# Patient Record
Sex: Female | Born: 1955 | Race: White | Hispanic: No | Marital: Married | Smoking: Current every day smoker
Health system: Southern US, Community
[De-identification: ages and names within clinical notes are randomized; demographics above are authoritative.]

## PROBLEM LIST (undated history)

## (undated) DIAGNOSIS — F329 Major depressive disorder, single episode, unspecified: Secondary | ICD-10-CM

## (undated) DIAGNOSIS — F419 Anxiety disorder, unspecified: Secondary | ICD-10-CM

## (undated) DIAGNOSIS — K219 Gastro-esophageal reflux disease without esophagitis: Secondary | ICD-10-CM

## (undated) DIAGNOSIS — M810 Age-related osteoporosis without current pathological fracture: Secondary | ICD-10-CM

## (undated) DIAGNOSIS — F32A Depression, unspecified: Secondary | ICD-10-CM

## (undated) HISTORY — DX: Anxiety disorder, unspecified: F41.9

## (undated) HISTORY — DX: Major depressive disorder, single episode, unspecified: F32.9

## (undated) HISTORY — DX: Age-related osteoporosis without current pathological fracture: M81.0

## (undated) HISTORY — DX: Depression, unspecified: F32.A

## (undated) HISTORY — DX: Gastro-esophageal reflux disease without esophagitis: K21.9

## (undated) HISTORY — PX: TUBAL LIGATION: SHX77

---

## 2006-11-29 ENCOUNTER — Ambulatory Visit: Payer: Self-pay | Admitting: Internal Medicine

## 2006-12-04 ENCOUNTER — Ambulatory Visit: Payer: Self-pay | Admitting: Internal Medicine

## 2008-02-24 IMAGING — US ULTRASOUND RIGHT BREAST
1 series · 17 of 25 positions shown · non-contrast
Comparison: none

REASON FOR EXAM: right breast nodular density  Us if needed
COMMENTS:

[Series 1: ultrasound right breast · 17 of 27 slices shown]
[im 1/27]
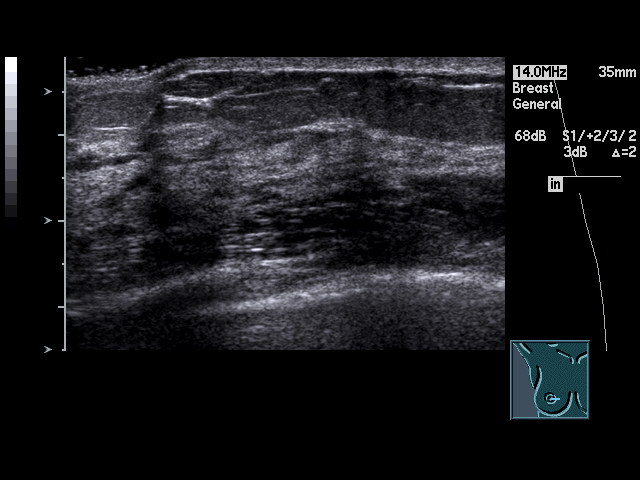
[im 3/27]
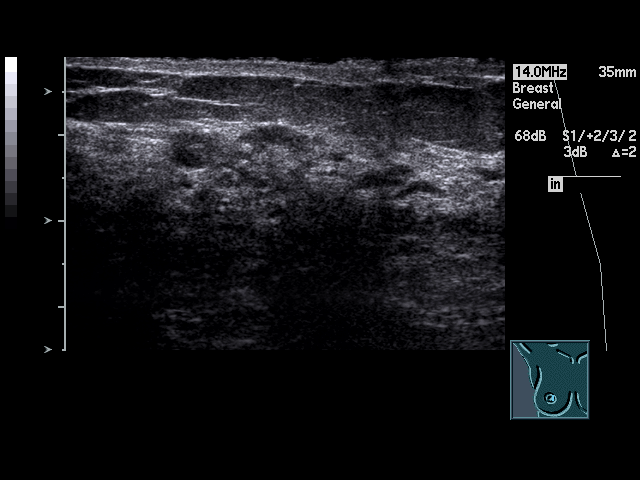
[im 4/27]
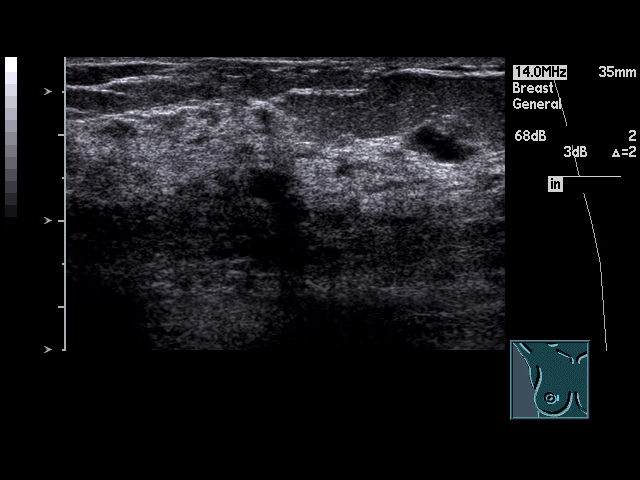
[im 6/27]
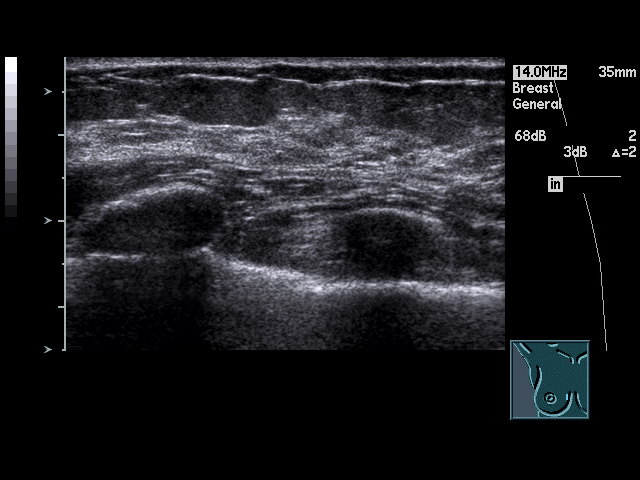
[im 7/27]
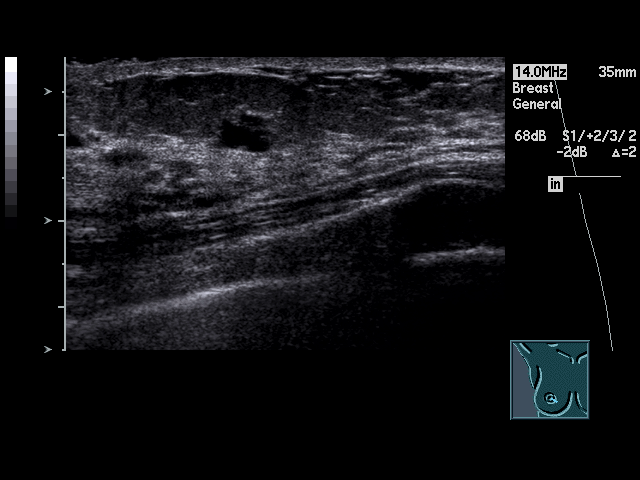
[im 9/27]
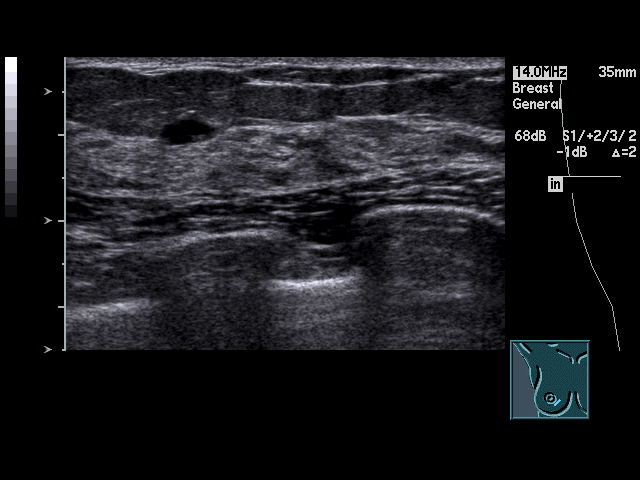
[im 10/27]
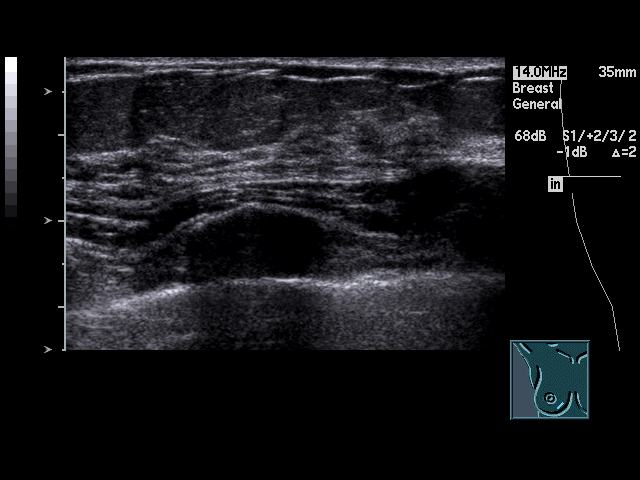
[im 12/27]
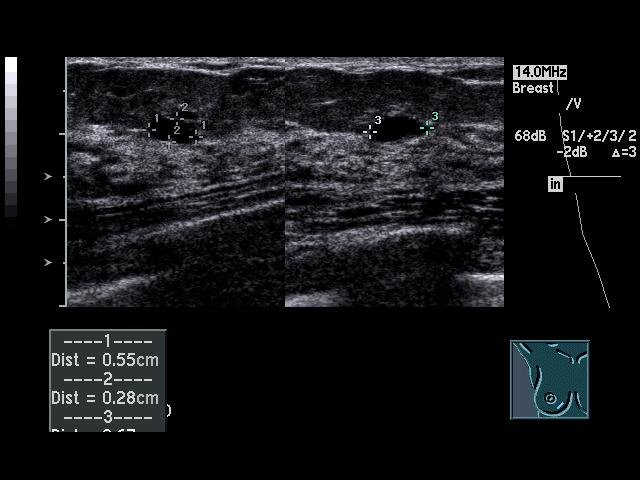
[im 14/27]
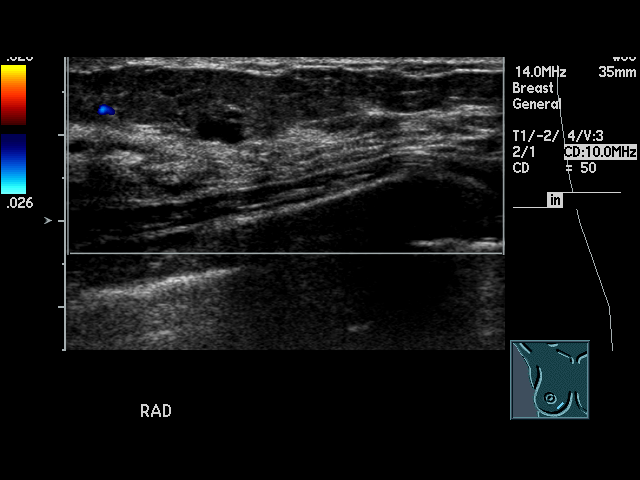
[im 15/27]
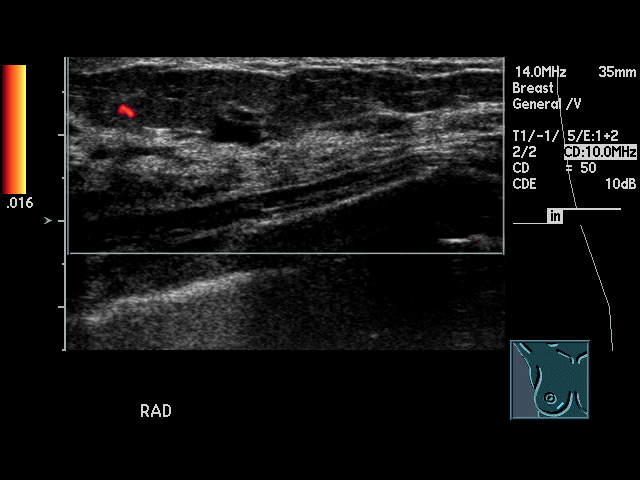
[im 17/27]
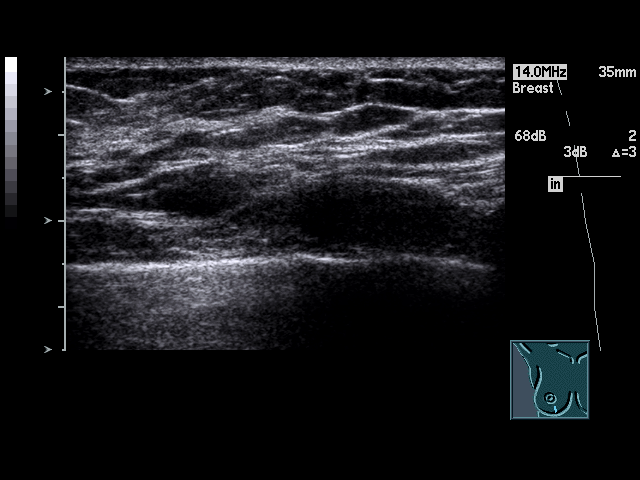
[im 18/27]
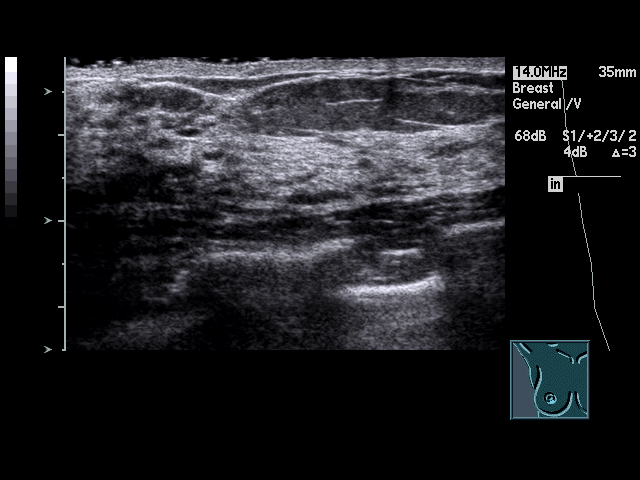
[im 20/27]
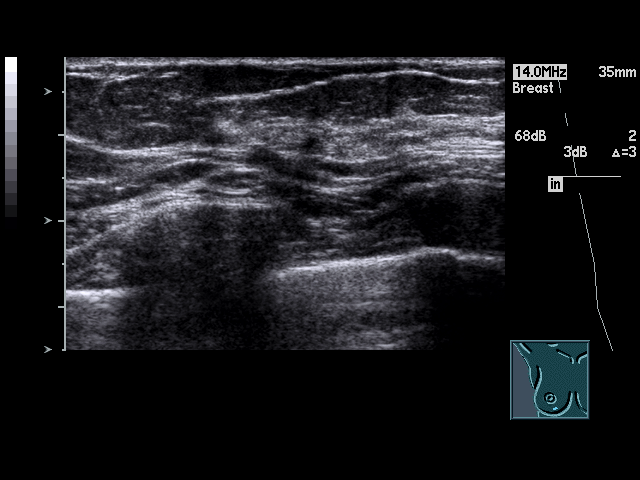
[im 21/27]
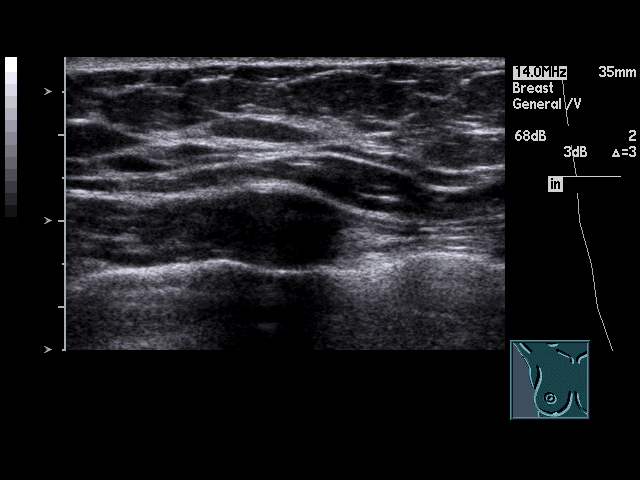
[im 23/27]
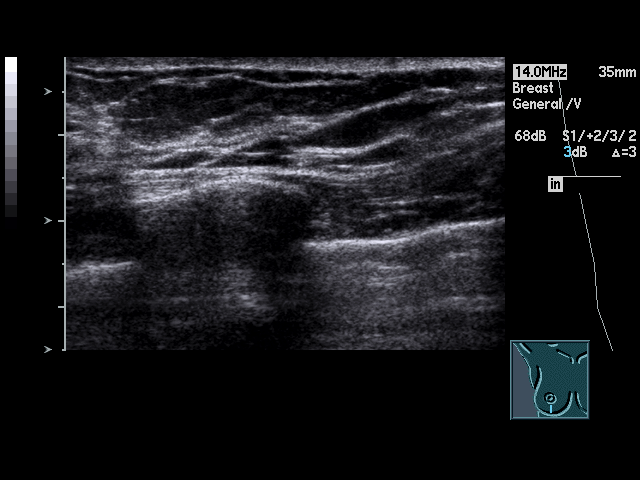
[im 24/27]
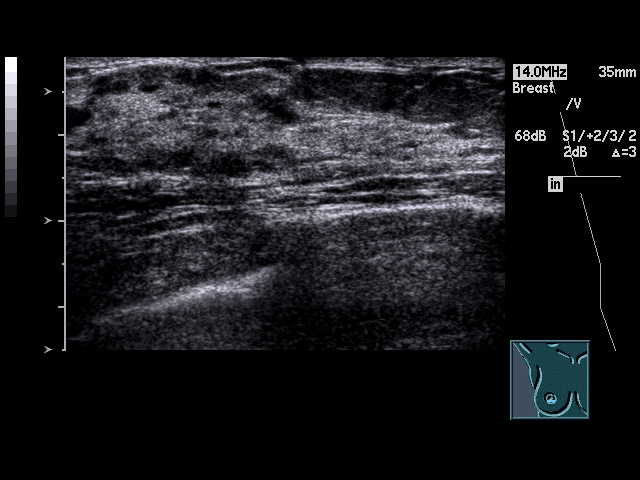
[im 27/27]
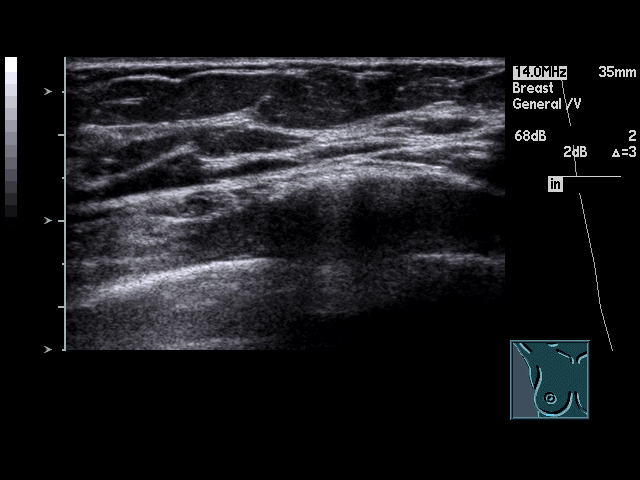

[17 of 25 positions shown; findings below may reference images not displayed]

PROCEDURE:     US  - US BREAST RIGHT  - December 04, 2006  [DATE]

RESULT:     Nodular density is noted in the medial portion of the
retroareolar portion of the RIGHT breast corresponding to abnormality on
mammogram.  The nodular density is anechoic and is consistent with a tiny
cyst.  No further evaluation is needed and yearly followup mammogram can be
obtained.
IMPRESSION: BI-RADS 2 - Benign Findings.

## 2009-01-13 ENCOUNTER — Ambulatory Visit: Payer: Self-pay | Admitting: Internal Medicine

## 2010-04-05 ENCOUNTER — Ambulatory Visit: Payer: Self-pay | Admitting: Internal Medicine

## 2010-05-12 ENCOUNTER — Ambulatory Visit: Payer: Self-pay | Admitting: General Practice

## 2010-06-21 ENCOUNTER — Ambulatory Visit: Payer: Self-pay | Admitting: Gastroenterology

## 2010-06-23 LAB — PATHOLOGY REPORT

## 2010-10-25 ENCOUNTER — Ambulatory Visit: Payer: Self-pay | Admitting: Otolaryngology

## 2011-01-28 ENCOUNTER — Ambulatory Visit: Payer: Self-pay

## 2011-03-07 ENCOUNTER — Ambulatory Visit: Payer: Self-pay

## 2011-03-10 ENCOUNTER — Ambulatory Visit: Payer: Self-pay

## 2011-08-02 IMAGING — CT CT ABDOMEN AND PELVIS WITHOUT AND WITH CONTRAST
2 of 4 series · 14 of 32 positions shown, 19 images · non-contrast
Comparison: none

REASON FOR EXAM: RENAL NEOPLASM UNCERTAIN HEMATURIA
COMMENTS:

PROCEDURE:     CT  - CT ABDOMEN / PELVIS  W/WO  - May 12, 2010  [DATE]
RESULT:
TECHNIQUE: Triphasic CT of the abdomen and pelvis is compared to a previous
noncontrast CT of the abdomen and pelvis dated 04/05/2010.

[Series 3: with · axial · 0.71mm/px · z∈[-140,+205]mm · 8 of 89 slices shown, 13 images]
[im 10/89  soft-tissue]
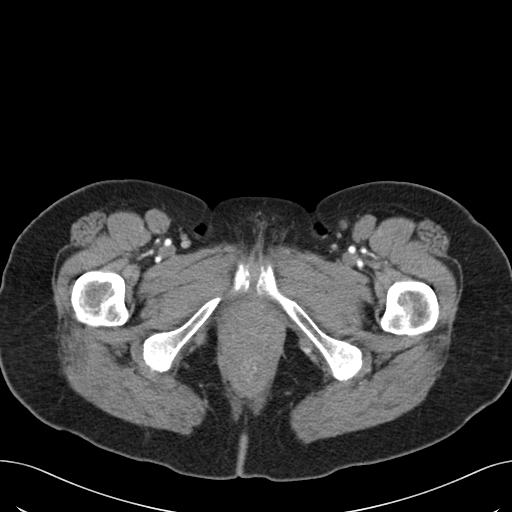
[im 10/89  bone]
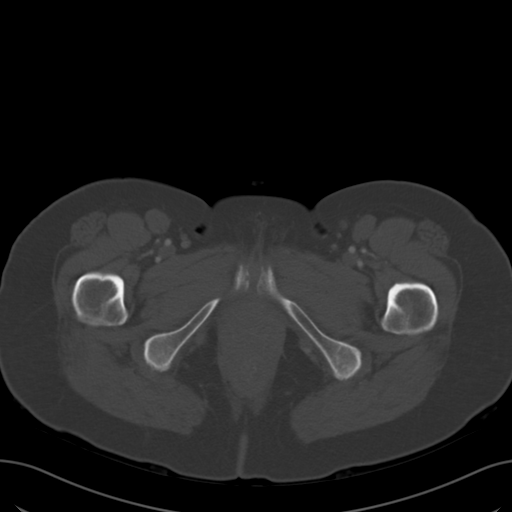
[im 20/89  soft-tissue]
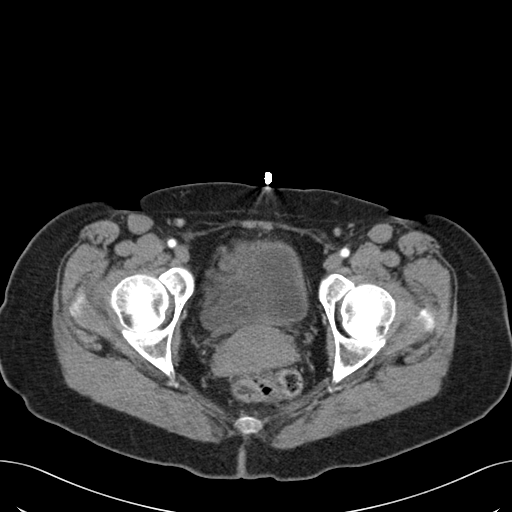
[im 30/89  soft-tissue]
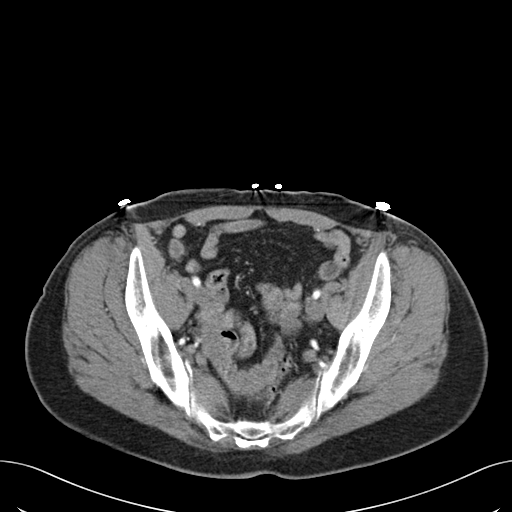
[im 40/89  soft-tissue]
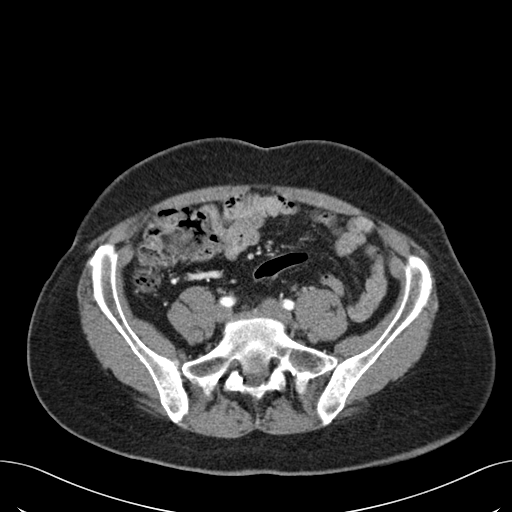
[im 49/89  soft-tissue]
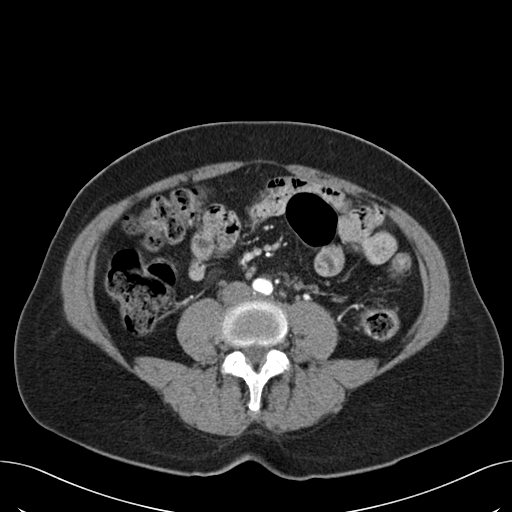
[im 49/89  lung]
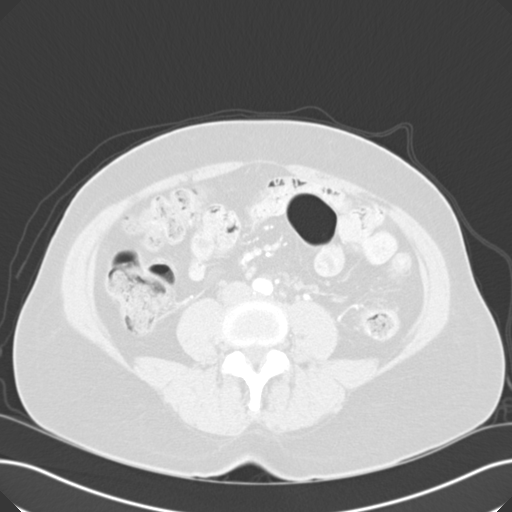
[im 59/89  soft-tissue]
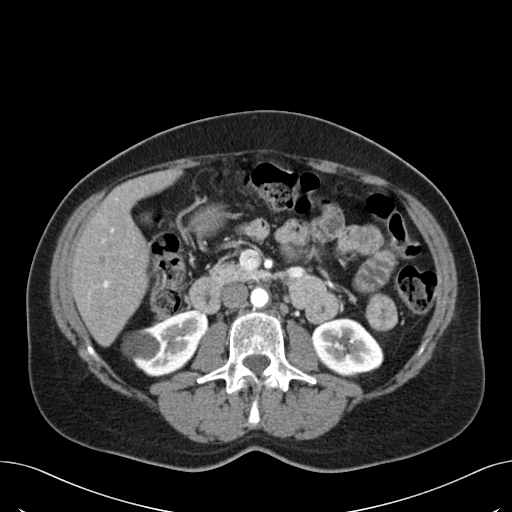
[im 59/89  lung]
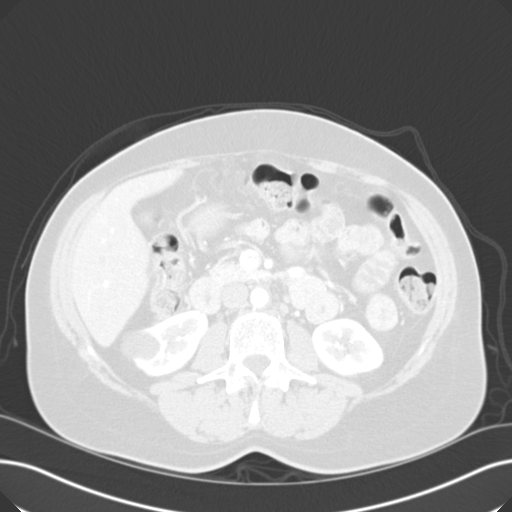
[im 69/89  soft-tissue]
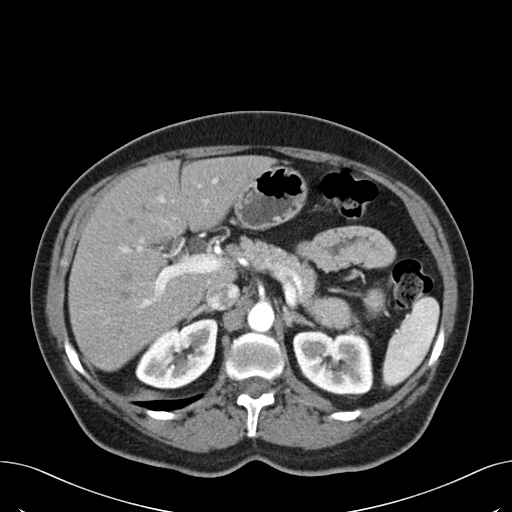
[im 69/89  lung]
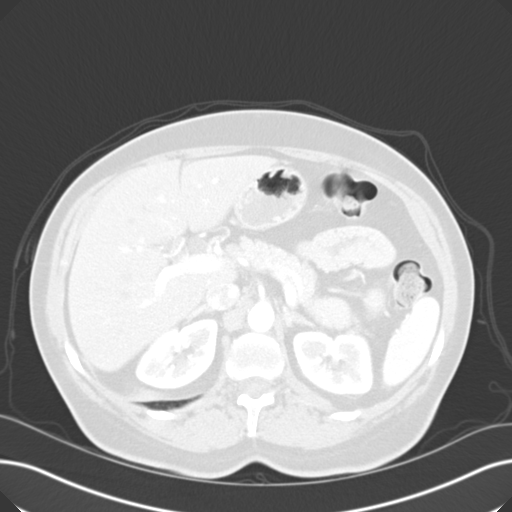
[im 79/89  soft-tissue]
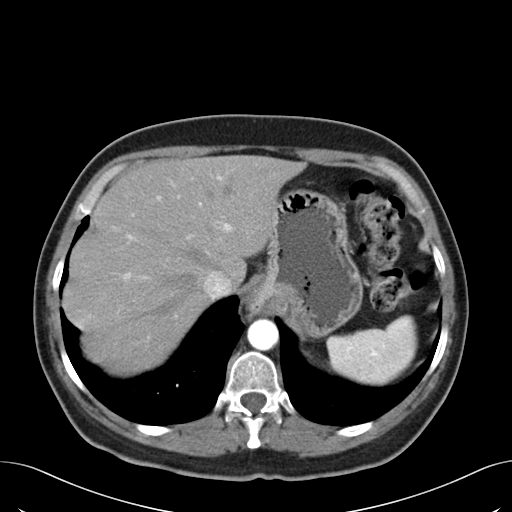
[im 79/89  lung]
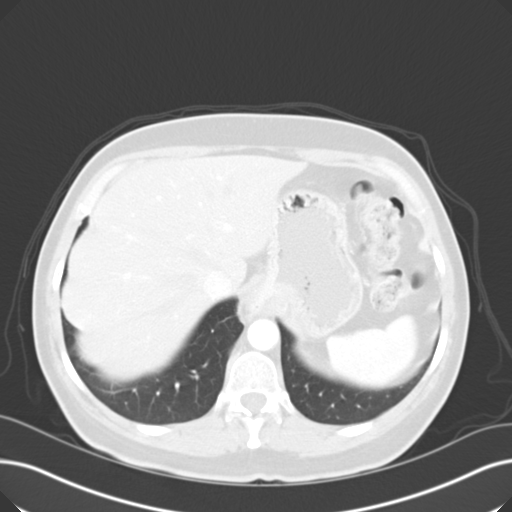

[Series 6: delay · axial · delayed · 0.71mm/px · z∈[-140,+105]mm · 6 of 89 slices shown]
[im 10/89  soft-tissue]
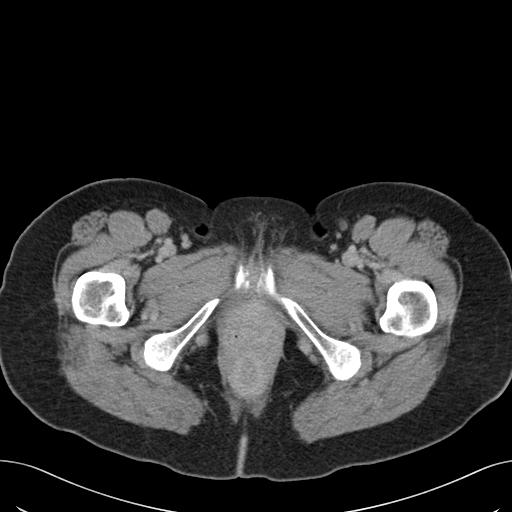
[im 20/89  soft-tissue]
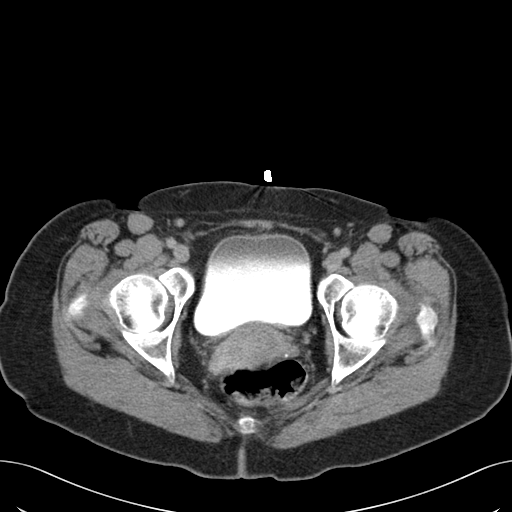
[im 30/89  soft-tissue]
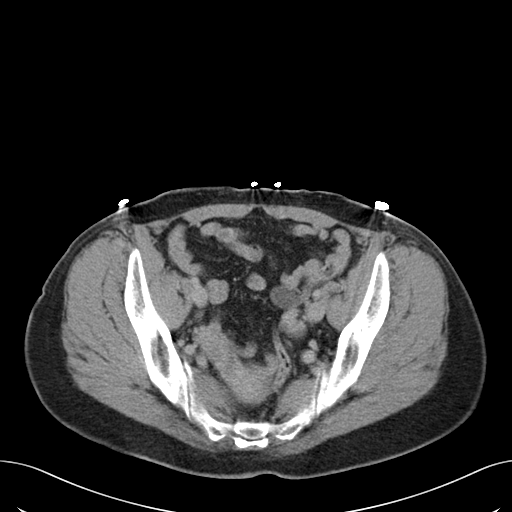
[im 40/89  soft-tissue]
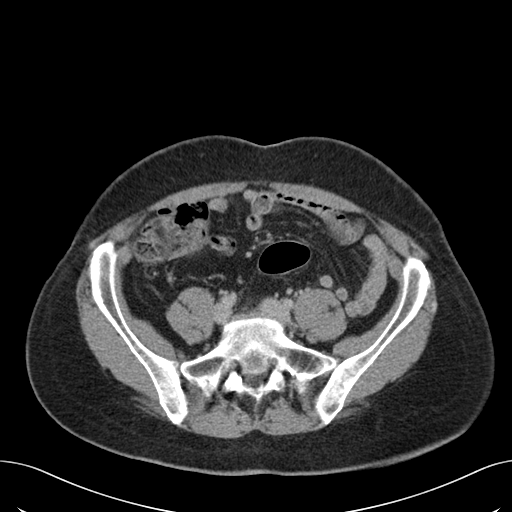
[im 49/89  soft-tissue]
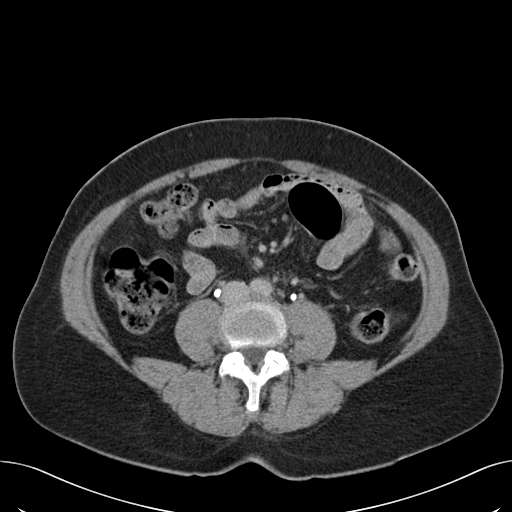
[im 59/89  soft-tissue]
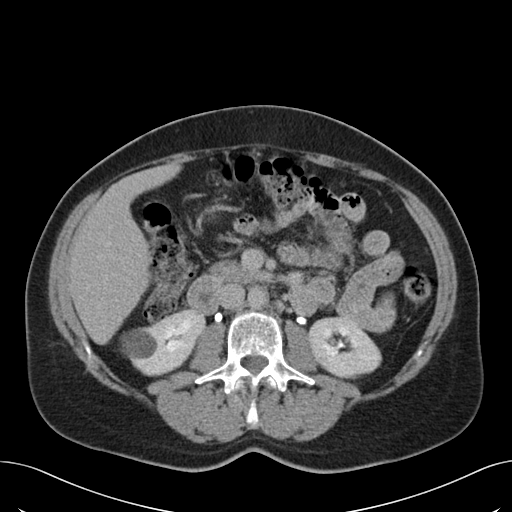

[14 of 32 positions shown; findings below may reference images not displayed]

FINDINGS: Noncontrast images show an exophytic, smoothly marginated lesion
arising from the junction of the mid and lower portions of the right kidney
demonstrating a Hounsfield reading of 7.0 and on the immediate post contrast
images this has a Hounsfield reading of minus 1. On delayed post contrast
images this has a Hounsfield reading of 5.0. This is most compatible with a
simple renal cyst.

A nonobstructing posterior mid left renal stone is present measuring
approximately 2 mm. No right nephrolithiasis is evident. There is no
hydroureteronephrosis. The urinary bladder appears nondistended. There is no
evidence of an acute inflammatory process in the abdomen or pelvis. No
radiopaque gallstones are evident. There is a small to moderate, hiatal
hernia. The aorta shows atherosclerotic irregularity without aneurysm. The
uterus is retroverted. No adnexal mass is appreciated. There is no
adenopathy. The liver and spleen enhance homogeneously. The adrenal glands
and pancreas appear unremarkable.

On delayed post contrast images both kidneys excrete contrast opacified
urine. There is no filling defect evident within the bladder.
IMPRESSION: 1.  Right renal cyst.
2.  Left nephrolithiasis.
3.  Moderately large, hiatal hernia.

## 2011-08-24 ENCOUNTER — Ambulatory Visit: Payer: Self-pay | Admitting: Gastroenterology

## 2012-04-19 IMAGING — CR CERVICAL SPINE - COMPLETE 4+ VIEW
1 series · 6 of 6 positions shown · non-contrast
Comparison: none

REASON FOR EXAM: neck pain post fall
COMMENTS:

[Series 1: view not recorded · 0.17mm/px · 6 of 6 slices shown]
[im 1/6]
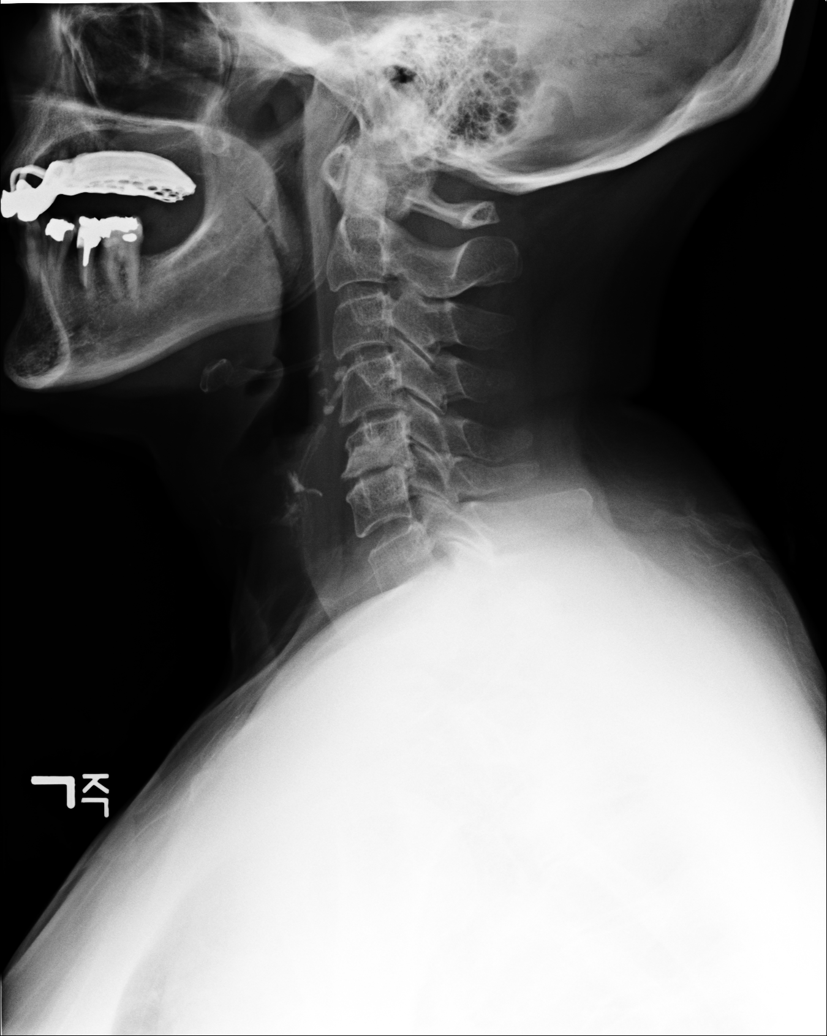
[im 2/6]
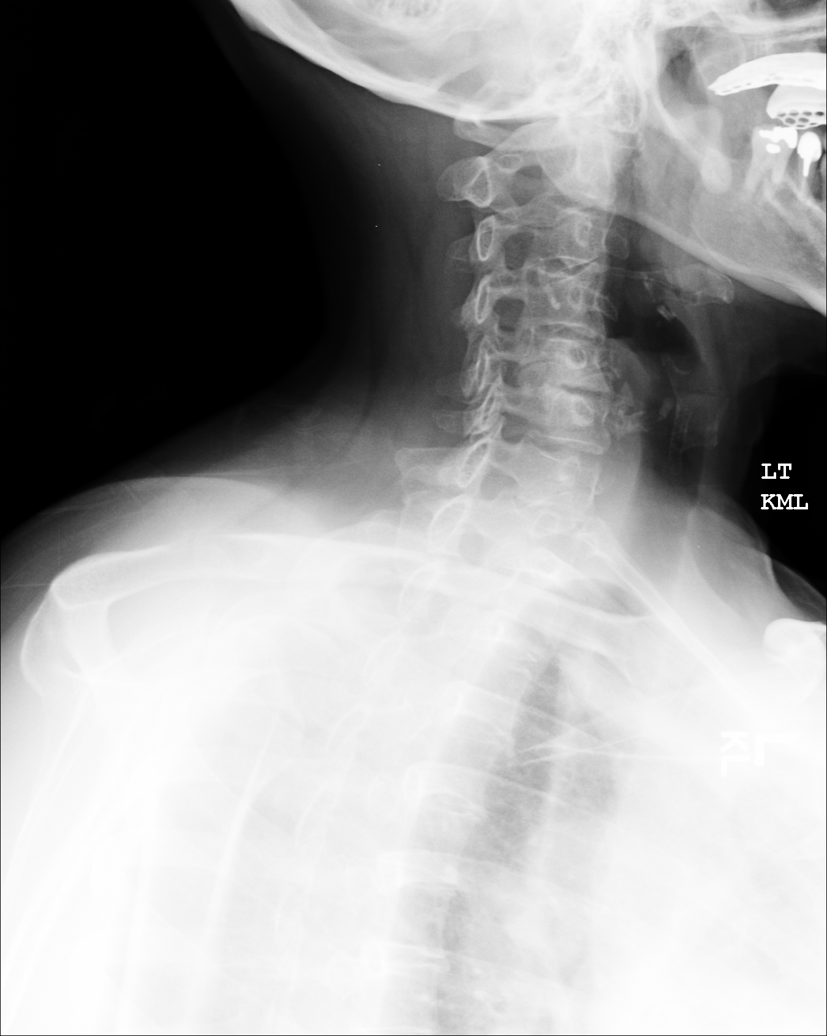
[im 3/6]
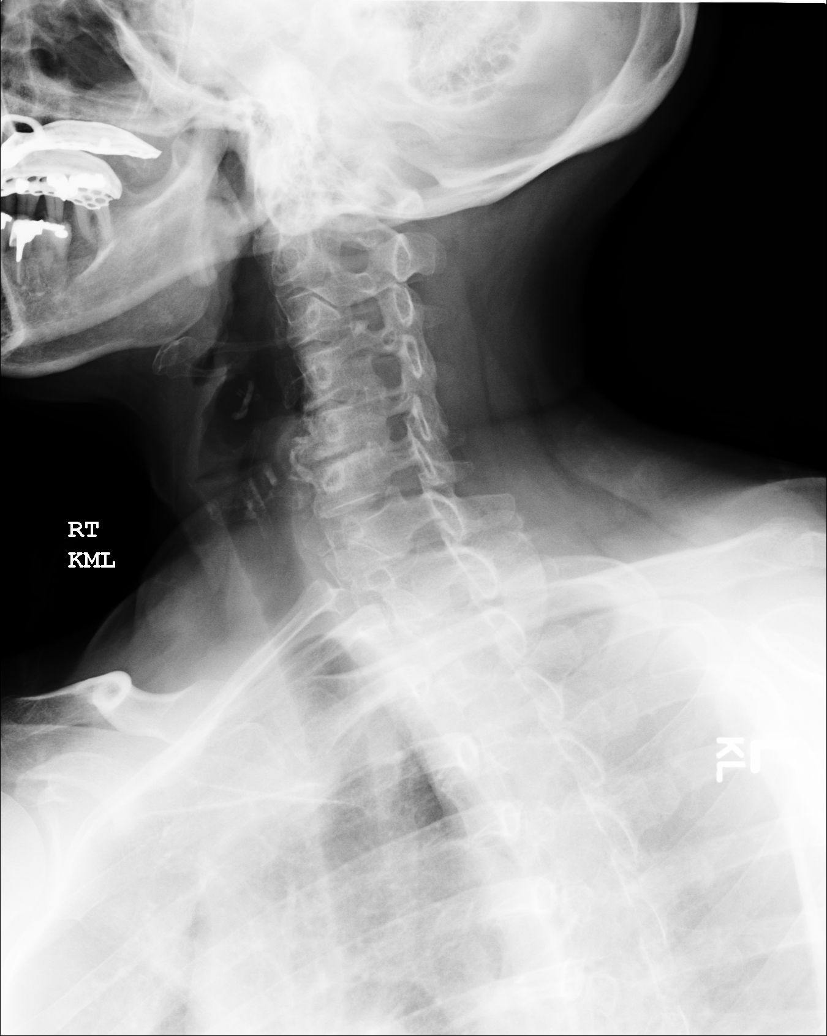
[im 4/6]
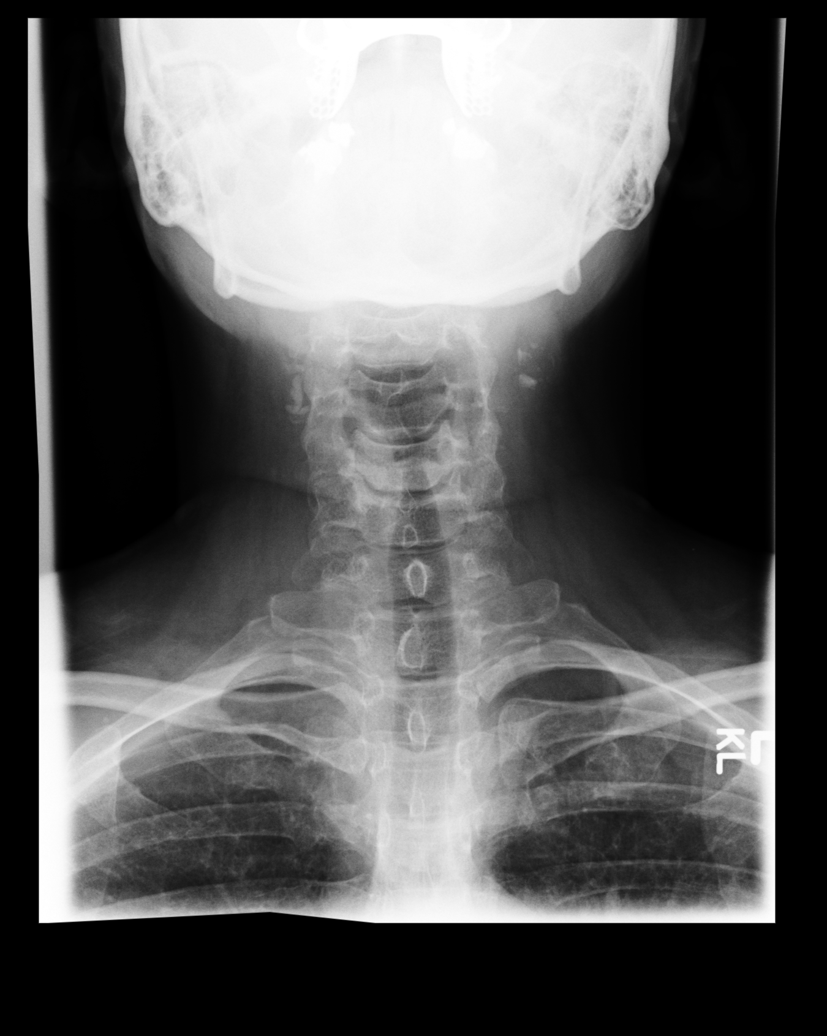
[im 5/6]
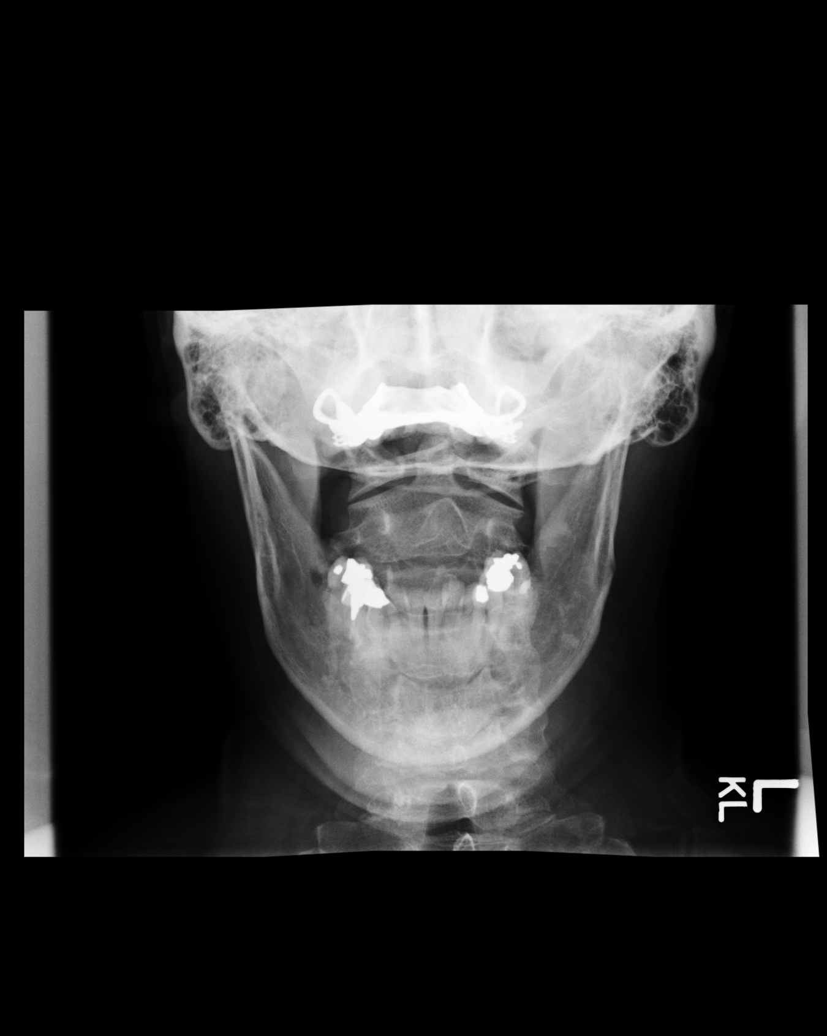
[im 6/6]
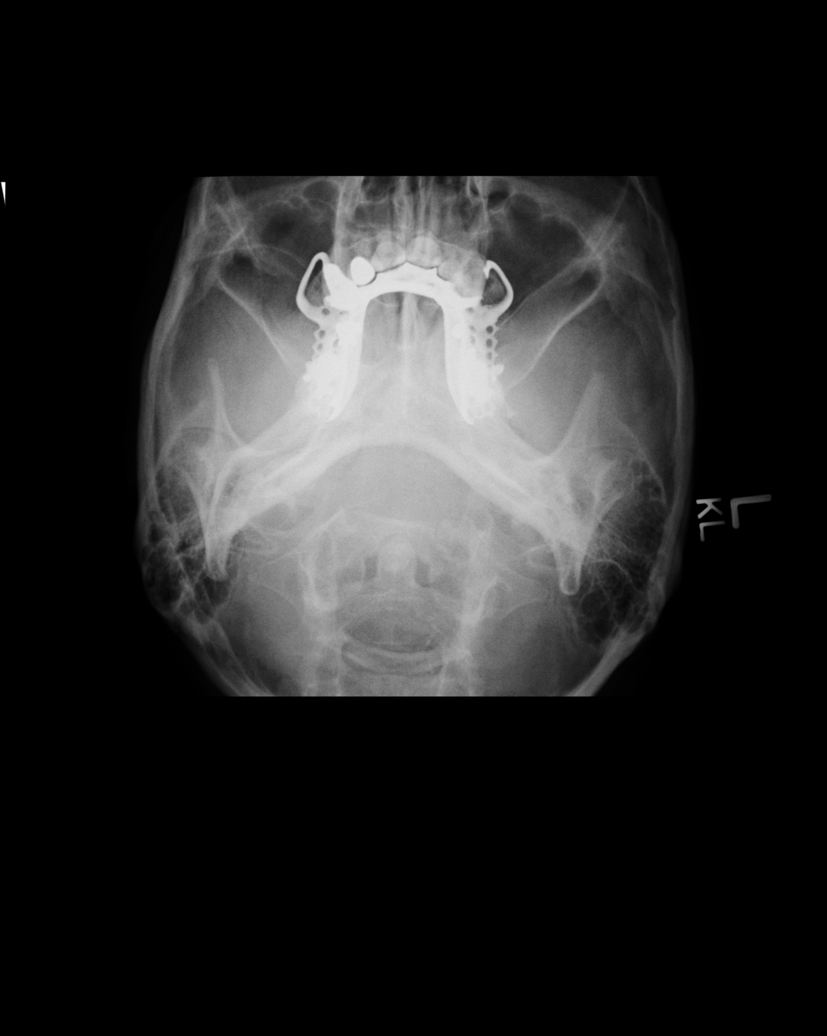

[6 of 6 positions shown; findings below may reference images not displayed]

PROCEDURE:     DXR - DXR CERVICAL SPINE COMPLETE  - January 28, 2011  [DATE]

RESULT:     The cervical vertebral bodies are preserved in height. There is
disc space narrowing with endplate sclerosis at C5-C6. Very mild
retrolisthesis of C5 with respect to C6 is demonstrated. The prevertebral
soft tissue spaces appear normal. The spinous processes are intact. The
oblique views reveal mild encroachment upon the neural foramina bilaterally
at C5-C6. The odontoid is intact. The lateral masses of C1 align normally
with those of C2. There are calcifications in the region of the carotid
bulbs bilaterally.
IMPRESSION: 1. The findings are consistent with degenerative change of the mid cervical
spine centered at C5-C6. Mild facet joint degenerative change is present in
addition to disc space narrowing. I do not see evidence of an acute fracture.
2. There is atherosclerotic calcification in the region of the cervical
carotid bulbs.

## 2013-01-16 ENCOUNTER — Ambulatory Visit: Payer: Self-pay | Admitting: Gastroenterology

## 2013-07-18 ENCOUNTER — Other Ambulatory Visit: Payer: Self-pay

## 2013-07-18 LAB — CBC WITH DIFFERENTIAL/PLATELET
Basophil #: 0.1 10*3/uL (ref 0.0–0.1)
Basophil %: 0.7 %
Eosinophil #: 0.1 10*3/uL (ref 0.0–0.7)
HCT: 39.4 % (ref 35.0–47.0)
HGB: 13.8 g/dL (ref 12.0–16.0)
Lymphocyte %: 33.5 %
MCHC: 35 g/dL (ref 32.0–36.0)
MCV: 92 fL (ref 80–100)
Monocyte #: 0.5 x10 3/mm (ref 0.2–0.9)
Neutrophil #: 4.8 10*3/uL (ref 1.4–6.5)
Neutrophil %: 58.4 %
RDW: 13.1 % (ref 11.5–14.5)
WBC: 8.3 10*3/uL (ref 3.6–11.0)

## 2013-07-18 LAB — COMPREHENSIVE METABOLIC PANEL
BUN: 13 mg/dL (ref 7–18)
Co2: 31 mmol/L (ref 21–32)
Creatinine: 0.82 mg/dL (ref 0.60–1.30)
EGFR (African American): >60
Glucose: 83 mg/dL (ref 65–99)
Osmolality: 277 (ref 275–301)
SGOT(AST): 17 U/L (ref 15–37)
Total Protein: 7.4 g/dL (ref 6.4–8.2)

## 2013-07-18 LAB — LIPID PANEL
Cholesterol: 223 mg/dL — ABNORMAL HIGH (ref 0–200)
HDL Cholesterol: 43 mg/dL (ref 40–60)
VLDL Cholesterol, Calc: 60 mg/dL — ABNORMAL HIGH (ref 5–40)

## 2013-07-18 LAB — TSH: Thyroid Stimulating Horm: 0.838 u[IU]/mL

## 2013-11-07 ENCOUNTER — Ambulatory Visit: Payer: Self-pay | Admitting: Gastroenterology

## 2017-12-26 ENCOUNTER — Other Ambulatory Visit: Payer: Self-pay

## 2017-12-26 MED ORDER — CITALOPRAM HYDROBROMIDE 20 MG PO TABS: 20.0000 mg | ORAL_TABLET | Freq: Two times a day (BID) | ORAL | 2 refills | Status: DC

## 2018-02-13 ENCOUNTER — Ambulatory Visit (INDEPENDENT_AMBULATORY_CARE_PROVIDER_SITE_OTHER): Payer: BLUE CROSS/BLUE SHIELD | Admitting: Nurse Practitioner

## 2018-02-13 ENCOUNTER — Encounter: Payer: Self-pay | Admitting: Nurse Practitioner

## 2018-02-13 VITALS — BP 140/84 | HR 69 | Resp 16 | Ht 62.0 in | Wt 151.4 lb

## 2018-02-13 DIAGNOSIS — F3341 Major depressive disorder, recurrent, in partial remission: Secondary | ICD-10-CM

## 2018-02-13 DIAGNOSIS — F411 Generalized anxiety disorder: Secondary | ICD-10-CM

## 2018-02-13 DIAGNOSIS — K219 Gastro-esophageal reflux disease without esophagitis: Secondary | ICD-10-CM

## 2018-02-13 MED ORDER — ALPRAZOLAM 0.5 MG PO TABS: 0.5000 mg | ORAL_TABLET | Freq: Two times a day (BID) | ORAL | 3 refills | Status: DC | PRN

## 2018-02-13 NOTE — Progress Notes (Signed)
[facility] [address] [place], [place] [address]  Internal MEDICINE  Office Visit Note  Patient Name: Felicia NeighborSherry B Blackwell  [id number]  [id number]  Date of Service: 02/14/2018  No chief complaint on file.   The patient is here for routine follow up exam. She has noted a few waves of sadness over the past few weeks. She states that this is worse on rainy and damp days. Her son committed suicide a little over a year ago. She is doing well, overall, except for these few episodes. She is taking her citalopram every day. Does take alprazolam as needed for acute anxiety and her waves of sadness. These medications help a lot and she needs to have refills    Pt is here for routine follow up.    Current Medication: Outpatient Encounter Medications as of 02/13/2018  Medication Sig  . albuterol (PROVENTIL HFA;VENTOLIN HFA) 108 (90 Base) MCG/ACT inhaler Inhale into the lungs.  . ALPRAZolam (XANAX) 0.5 MG tablet Take 1 tablet (0.5 mg total) by mouth 2 (two) times daily as needed for anxiety.  Marland Kitchen. amoxicillin-clavulanate (AUGMENTIN) 875-125 MG tablet   . citalopram (CELEXA) 20 MG tablet Take 1 tablet (20 mg total) by mouth 2 (two) times daily.  . ranitidine (ZANTAC) 150 MG tablet Take 150 mg by mouth daily as needed for heartburn.  . [DISCONTINUED] ALPRAZolam (XANAX) 0.5 MG tablet    No facility-administered encounter medications on file as of 02/13/2018.     Surgical History: Past Surgical History:  Procedure Laterality Date  . TUBAL LIGATION      Medical History: Past Medical History:  Diagnosis Date  . Anxiety   . Depression   . GERD (gastroesophageal reflux disease)     Family History: No family history on file.  Social History   Socioeconomic History  . Marital status: Married    Spouse name: Not on file  . Number of children: Not on file  . Years of education: Not on file  . Highest education level: Not on file  Occupational History  . Not on file  Social Needs   . Financial resource strain: Not on file  . Food insecurity:    Worry: Not on file    Inability: Not on file  . Transportation needs:    Medical: Not on file    Non-medical: Not on file  Tobacco Use  . Smoking status: Current Every Day Smoker    Packs/day: 1.00    Types: Cigarettes  . Smokeless tobacco: Never Used  Substance and Sexual Activity  . Alcohol use: Never    Frequency: Never  . Drug use: Never  . Sexual activity: Not on file  Lifestyle  . Physical activity:    Days per week: Not on file    Minutes per session: Not on file  . Stress: Not on file  Relationships  . Social connections:    Talks on phone: Not on file    Gets together: Not on file    Attends religious service: Not on file    Active member of club or organization: Not on file    Attends meetings of clubs or organizations: Not on file    Relationship status: Not on file  . Intimate partner violence:    Fear of current or ex partner: Not on file    Emotionally abused: Not on file    Physically abused: Not on file    Forced sexual activity: Not on file  Other Topics Concern  .  Not on file  Social History Narrative  . Not on file      Review of Systems  Constitutional: Negative for activity change, chills, fatigue and unexpected weight change.  HENT: Positive for voice change. Negative for congestion, postnasal drip, rhinorrhea, sneezing and sore throat.   Eyes: Negative.  Negative for redness.  Respiratory: Negative for cough, chest tightness, shortness of breath and wheezing.   Cardiovascular: Negative for chest pain and palpitations.  Gastrointestinal: Negative for abdominal pain, constipation, diarrhea, nausea and vomiting.  Endocrine: Negative for cold intolerance, heat intolerance, polydipsia, polyphagia and polyuria.  Genitourinary: Negative.  Negative for dysuria and frequency.  Musculoskeletal: Negative for arthralgias, back pain, joint swelling and neck pain.  Skin: Negative for rash.   Allergic/Immunologic: Positive for environmental allergies.  Neurological: Positive for headaches. Negative for dizziness, tremors, weakness and numbness.  Hematological: Negative for adenopathy. Does not bruise/bleed easily.  Psychiatric/Behavioral: Positive for dysphoric mood. Negative for behavioral problems (Depression), sleep disturbance and suicidal ideas. The patient is nervous/anxious.     Today's Vitals   02/13/18 1143 02/14/18 0952  BP: (!) 162/96 140/84  Pulse: 69   Resp: 16   SpO2: 98%   Weight: 151 lb 6.4 oz (68.7 kg)   Height: 5\' 2"  (1.575 m)     Physical Exam  Constitutional: She is oriented to person, place, and time. She appears well-developed and well-nourished. No distress.  HENT:  Head: Normocephalic and atraumatic.  Mouth/Throat: Oropharynx is clear and moist. No oropharyngeal exudate.  Eyes: Pupils are equal, round, and reactive to light. EOM are normal.  Neck: Normal range of motion. Neck supple. No JVD present. Carotid bruit is not present. No tracheal deviation present. No thyromegaly present.  Cardiovascular: Normal rate, regular rhythm and normal heart sounds. Exam reveals no gallop and no friction rub.  No murmur heard. Pulmonary/Chest: Effort normal and breath sounds normal. No respiratory distress. She has no wheezes. She has no rales. She exhibits no tenderness.  Abdominal: Soft. Bowel sounds are normal. There is no tenderness.  Musculoskeletal: Normal range of motion.  Lymphadenopathy:    She has no cervical adenopathy.  Neurological: She is alert and oriented to person, place, and time. No cranial nerve deficit.  Skin: Skin is warm and dry. She is not diaphoretic.  Psychiatric: Her behavior is normal. Judgment and thought content normal. Her mood appears anxious. Cognition and memory are normal.  Nursing note and vitals reviewed.   Assessment/Plan: 1. Generalized anxiety disorder Overall, anxiety well controlled. May continue taking alprazolam  0.5mg  twice daily if needed for anxiety. New rx proided today.  - ALPRAZolam (XANAX) 0.5 MG tablet; Take 1 tablet (0.5 mg total) by mouth 2 (two) times daily as needed for anxiety.  Dispense: 60 tablet; Refill: 3  2. Recurrent major depressive disorder, in partial remission (HCC) Generally well controlled. Continue citalopram daily   3. Gastroesophageal reflux disease without esophagitis Continue uranidine as before.  General Counseling: mishael krysiak understanding of the findings of todays visit and agrees with plan of treatment. I have discussed any further diagnostic evaluation that may be needed or ordered today. We also reviewed her medications today. she has been encouraged to call the office with any questions or concerns that should arise related to todays visit.   This patient was seen by Vincent Gros, FNP- C in Collaboration with Dr Lyndon Code as a part of collaborative care agreement   Meds ordered this encounter  Medications  . ALPRAZolam (XANAX) 0.5 MG tablet  Sig: Take 1 tablet (0.5 mg total) by mouth 2 (two) times daily as needed for anxiety.    Dispense:  60 tablet    Refill:  3    Please add refills to current prescription. Not ready for refill at this time.    Order Specific Question:   Supervising Provider    Answer:   Lyndon Code [[id number]]    Time spent: 60 Minutes      Dr Lyndon Code Internal medicine

## 2018-02-14 DIAGNOSIS — K219 Gastro-esophageal reflux disease without esophagitis: Secondary | ICD-10-CM | POA: Insufficient documentation

## 2018-02-14 DIAGNOSIS — F329 Major depressive disorder, single episode, unspecified: Secondary | ICD-10-CM | POA: Insufficient documentation

## 2018-02-14 DIAGNOSIS — F411 Generalized anxiety disorder: Secondary | ICD-10-CM | POA: Insufficient documentation

## 2018-02-14 DIAGNOSIS — F32A Depression, unspecified: Secondary | ICD-10-CM | POA: Insufficient documentation

## 2018-04-09 ENCOUNTER — Other Ambulatory Visit: Payer: Self-pay

## 2018-04-09 MED ORDER — CITALOPRAM HYDROBROMIDE 20 MG PO TABS: 20.0000 mg | ORAL_TABLET | Freq: Two times a day (BID) | ORAL | 2 refills | Status: DC

## 2018-06-14 ENCOUNTER — Encounter: Payer: Self-pay | Admitting: Nurse Practitioner

## 2018-06-14 ENCOUNTER — Ambulatory Visit: Payer: BLUE CROSS/BLUE SHIELD | Admitting: Nurse Practitioner

## 2018-06-14 VITALS — BP 138/70 | HR 68 | Resp 16 | Ht 62.0 in | Wt 152.8 lb

## 2018-06-14 DIAGNOSIS — K219 Gastro-esophageal reflux disease without esophagitis: Secondary | ICD-10-CM

## 2018-06-14 DIAGNOSIS — F3341 Major depressive disorder, recurrent, in partial remission: Secondary | ICD-10-CM | POA: Diagnosis not present

## 2018-06-14 DIAGNOSIS — F411 Generalized anxiety disorder: Secondary | ICD-10-CM

## 2018-06-14 MED ORDER — CITALOPRAM HYDROBROMIDE 20 MG PO TABS: 20.0000 mg | ORAL_TABLET | Freq: Two times a day (BID) | ORAL | 2 refills | Status: DC

## 2018-06-14 MED ORDER — ALPRAZOLAM 0.5 MG PO TABS: 0.5000 mg | ORAL_TABLET | Freq: Two times a day (BID) | ORAL | 3 refills | Status: DC | PRN

## 2018-06-14 NOTE — Progress Notes (Signed)
[facility] [address] [place], [place] [address]  Internal MEDICINE  Office Visit Note  Patient Name: Felicia Blackwell  [id number]  [id number]  Date of Service: 06/14/2018  Chief Complaint  Patient presents with  . Anxiety    4 month follow up    She has noted worsening depression last week. It was her son's 62 year old's birthday. Her son committed suicide a little over a year ago. She is doing well, overall, except for these few episodes. This week is much better.  She is taking her citalopram every day. Does take alprazolam as needed for acute anxiety and her waves of sadness. These medications help a lot and she needs to have refills       Current Medication: Outpatient Encounter Medications as of 06/14/2018  Medication Sig  . ALPRAZolam (XANAX) 0.5 MG tablet Take 1 tablet (0.5 mg total) by mouth 2 (two) times daily as needed for anxiety.  Marland Kitchen amoxicillin-clavulanate (AUGMENTIN) 875-125 MG tablet   . citalopram (CELEXA) 20 MG tablet Take 1 tablet (20 mg total) by mouth 2 (two) times daily.  . ranitidine (ZANTAC) 150 MG tablet Take 150 mg by mouth daily as needed for heartburn.  . [DISCONTINUED] ALPRAZolam (XANAX) 0.5 MG tablet Take 1 tablet (0.5 mg total) by mouth 2 (two) times daily as needed for anxiety.  . [DISCONTINUED] citalopram (CELEXA) 20 MG tablet Take 1 tablet (20 mg total) by mouth 2 (two) times daily.  Marland Kitchen albuterol (PROVENTIL HFA;VENTOLIN HFA) 108 (90 Base) MCG/ACT inhaler Inhale into the lungs.   No facility-administered encounter medications on file as of 06/14/2018.     Surgical History: Past Surgical History:  Procedure Laterality Date  . TUBAL LIGATION      Medical History: Past Medical History:  Diagnosis Date  . Anxiety   . Depression   . GERD (gastroesophageal reflux disease)     Family History: Family History  Family history unknown: Yes    Social History   Socioeconomic History  . Marital status: Married    Spouse name: Not  on file  . Number of children: Not on file  . Years of education: Not on file  . Highest education level: Not on file  Occupational History  . Not on file  Social Needs  . Financial resource strain: Not on file  . Food insecurity:    Worry: Not on file    Inability: Not on file  . Transportation needs:    Medical: Not on file    Non-medical: Not on file  Tobacco Use  . Smoking status: Current Every Day Smoker    Packs/day: 1.00    Types: Cigarettes  . Smokeless tobacco: Never Used  Substance and Sexual Activity  . Alcohol use: Never    Frequency: Never  . Drug use: Never  . Sexual activity: Not on file  Lifestyle  . Physical activity:    Days per week: Not on file    Minutes per session: Not on file  . Stress: Not on file  Relationships  . Social connections:    Talks on phone: Not on file    Gets together: Not on file    Attends religious service: Not on file    Active member of club or organization: Not on file    Attends meetings of clubs or organizations: Not on file    Relationship status: Not on file  . Intimate partner violence:    Fear of current or ex partner: Not on file  Emotionally abused: Not on file    Physically abused: Not on file    Forced sexual activity: Not on file  Other Topics Concern  . Not on file  Social History Narrative  . Not on file      Review of Systems  Constitutional: Negative for activity change, chills, fatigue and unexpected weight change.  HENT: Positive for voice change. Negative for congestion, postnasal drip, rhinorrhea, sneezing and sore throat.   Eyes: Negative.  Negative for redness.  Respiratory: Negative for cough, chest tightness, shortness of breath and wheezing.   Cardiovascular: Negative for chest pain and palpitations.  Gastrointestinal: Negative for abdominal pain, constipation, diarrhea, nausea and vomiting.  Endocrine: Negative for cold intolerance, heat intolerance, polydipsia, polyphagia and polyuria.   Genitourinary: Negative.  Negative for dysuria and frequency.  Musculoskeletal: Negative for arthralgias, back pain, joint swelling and neck pain.  Skin: Negative for rash.  Allergic/Immunologic: Positive for environmental allergies.  Neurological: Positive for headaches. Negative for dizziness, tremors, weakness and numbness.  Hematological: Negative for adenopathy. Does not bruise/bleed easily.  Psychiatric/Behavioral: Positive for dysphoric mood. Negative for behavioral problems (Depression), sleep disturbance and suicidal ideas. The patient is nervous/anxious.     Vital Signs: BP 138/70 (BP Location: Right Arm, Patient Position: Sitting, Cuff Size: Large)   Pulse 68   Resp 16   Ht 5\' 2"  (1.575 m)   Wt 152 lb 12.8 oz (69.3 kg)   SpO2 93%   BMI 27.95 kg/m    Physical Exam  Constitutional: She is oriented to person, place, and time. She appears well-developed and well-nourished. No distress.  HENT:  Head: Normocephalic and atraumatic.  Nose: Nose normal.  Mouth/Throat: Oropharynx is clear and moist. No oropharyngeal exudate.  Eyes: Pupils are equal, round, and reactive to light. Conjunctivae and EOM are normal.  Neck: Normal range of motion. Neck supple. No JVD present. Carotid bruit is not present. No tracheal deviation present. No thyromegaly present.  Cardiovascular: Normal rate, regular rhythm and normal heart sounds. Exam reveals no gallop and no friction rub.  No murmur heard. Pulmonary/Chest: Effort normal and breath sounds normal. No respiratory distress. She has no wheezes. She has no rales. She exhibits no tenderness.  Abdominal: Soft. Bowel sounds are normal. There is no tenderness.  Musculoskeletal: Normal range of motion.  Lymphadenopathy:    She has no cervical adenopathy.  Neurological: She is alert and oriented to person, place, and time. No cranial nerve deficit.  Skin: Skin is warm and dry. She is not diaphoretic.  Psychiatric: Her behavior is normal.  Judgment and thought content normal. Her mood appears anxious. Cognition and memory are normal.  Nursing note and vitals reviewed.   Assessment/Plan: 1. Gastroesophageal reflux disease without esophagitis Well managed, continue omeprazole as prescribed   2. Recurrent major depressive disorder, in partial remission (HCC) Continue citalopram 20mg , taking two tablets daily. New rx provided today.   3. Generalized anxiety disorder May continue alprazolam 0.5mg  twice daily for acute anxiety. New prescription provided today.  - ALPRAZolam (XANAX) 0.5 MG tablet; Take 1 tablet (0.5 mg total) by mouth 2 (two) times daily as needed for anxiety.  Dispense: 60 tablet; Refill: 3 - citalopram (CELEXA) 20 MG tablet; Take 1 tablet (20 mg total) by mouth 2 (two) times daily.  Dispense: 60 tablet; Refill: 2  General Counseling: Nyara verbalizes understanding of the findings of todays visit and agrees with plan of treatment. I have discussed any further diagnostic evaluation that may be needed or ordered today.  We also reviewed her medications today. she has been encouraged to call the office with any questions or concerns that should arise related to todays visit.  This patient was seen by Vincent GrosHeather Irva Loser FNP Collaboration with Dr Lyndon CodeFozia M Khan as a part of collaborative care agreement  Meds ordered this encounter  Medications  . ALPRAZolam (XANAX) 0.5 MG tablet    Sig: Take 1 tablet (0.5 mg total) by mouth 2 (two) times daily as needed for anxiety.    Dispense:  60 tablet    Refill:  3    Please add refills to current prescription. Not ready for refill at this time.    Order Specific Question:   Supervising Provider    Answer:   Lyndon CodeKHAN, FOZIA M [[id number]]  . citalopram (CELEXA) 20 MG tablet    Sig: Take 1 tablet (20 mg total) by mouth 2 (two) times daily.    Dispense:  60 tablet    Refill:  2    Order Specific Question:   Supervising Provider    Answer:   Lyndon CodeKHAN, FOZIA M [[id number]]    Time spent: 5215  Minutes      Dr Lyndon CodeFozia M Khan Internal medicine

## 2018-07-02 ENCOUNTER — Other Ambulatory Visit: Payer: Self-pay

## 2018-07-02 DIAGNOSIS — F411 Generalized anxiety disorder: Secondary | ICD-10-CM

## 2018-07-02 MED ORDER — CITALOPRAM HYDROBROMIDE 20 MG PO TABS: 20.0000 mg | ORAL_TABLET | Freq: Two times a day (BID) | ORAL | 3 refills | Status: DC

## 2018-08-20 ENCOUNTER — Ambulatory Visit: Payer: BLUE CROSS/BLUE SHIELD | Admitting: Nurse Practitioner

## 2018-08-20 ENCOUNTER — Encounter: Payer: Self-pay | Admitting: Nurse Practitioner

## 2018-08-20 VITALS — BP 151/72 | HR 68 | Temp 97.4°F | Resp 16 | Ht 62.0 in | Wt 152.0 lb

## 2018-08-20 DIAGNOSIS — Z7689 Persons encountering health services in other specified circumstances: Secondary | ICD-10-CM

## 2018-08-20 DIAGNOSIS — B373 Candidiasis of vulva and vagina: Secondary | ICD-10-CM | POA: Diagnosis not present

## 2018-08-20 DIAGNOSIS — R319 Hematuria, unspecified: Secondary | ICD-10-CM

## 2018-08-20 DIAGNOSIS — R030 Elevated blood-pressure reading, without diagnosis of hypertension: Secondary | ICD-10-CM | POA: Diagnosis not present

## 2018-08-20 DIAGNOSIS — N390 Urinary tract infection, site not specified: Secondary | ICD-10-CM | POA: Diagnosis not present

## 2018-08-20 DIAGNOSIS — B3731 Acute candidiasis of vulva and vagina: Secondary | ICD-10-CM

## 2018-08-20 DIAGNOSIS — R300 Dysuria: Secondary | ICD-10-CM | POA: Diagnosis not present

## 2018-08-20 DIAGNOSIS — Z202 Contact with and (suspected) exposure to infections with a predominantly sexual mode of transmission: Secondary | ICD-10-CM

## 2018-08-20 LAB — POCT URINALYSIS DIPSTICK
BILIRUBIN UA: NEGATIVE
GLUCOSE UA: NEGATIVE
Ketones, UA: NEGATIVE
Leukocytes, UA: NEGATIVE
Nitrite, UA: NEGATIVE
Protein, UA: NEGATIVE
Spec Grav, UA: 1.01 (ref 1.010–1.025)
Urobilinogen, UA: 0.2 E.U./dL
pH, UA: 6 (ref 5.0–8.0)

## 2018-08-20 MED ORDER — CIPROFLOXACIN HCL 500 MG PO TABS: 500.0000 mg | ORAL_TABLET | Freq: Two times a day (BID) | ORAL | 0 refills | Status: DC

## 2018-08-20 MED ORDER — PHENAZOPYRIDINE HCL 200 MG PO TABS: 200.0000 mg | ORAL_TABLET | Freq: Three times a day (TID) | ORAL | 0 refills | Status: DC | PRN

## 2018-08-20 MED ORDER — FLUCONAZOLE 150 MG PO TABS: ORAL_TABLET | ORAL | 0 refills | Status: DC

## 2018-08-20 NOTE — Progress Notes (Signed)
[facility] [address] [place], [place] [address]  Internal MEDICINE  Office Visit Note  Patient Name: Felicia Blackwell  [id number]  [id number]  Date of Service: 08/21/2018   Pt is here for a sick visit.  Chief Complaint  Patient presents with  . Urinary Tract Infection    lower abdominal/pelvic pain.     Patient states that she has had vaginal irritation for some time. Got worse after having intercourse with her husband in late August. She is concerned of exposure to sexually transmitted infection, as her husband has history of extramarital affairs. She is describing pelvic pain which gets worse when she urinates. Feels like a lot of pressure pushing down, similar to that of labor pains.   Urinary Tract Infection   This is a new problem. The current episode started more than 1 month ago. The problem occurs every urination. The problem has been gradually worsening. The quality of the pain is described as aching. The pain is at a severity of 4/10. The pain is moderate. There has been no fever. She is sexually active. There is no history of pyelonephritis. Associated symptoms include flank pain, frequency, hematuria and urgency. Pertinent negatives include no chills, nausea or vomiting. She has tried NSAIDs for the symptoms. The treatment provided mild relief.       Current Medication:  Outpatient Encounter Medications as of 08/20/2018  Medication Sig  . ALPRAZolam (XANAX) 0.5 MG tablet Take 1 tablet (0.5 mg total) by mouth 2 (two) times daily as needed for anxiety.  . citalopram (CELEXA) 20 MG tablet Take 1 tablet (20 mg total) by mouth 2 (two) times daily.  . ranitidine (ZANTAC) 150 MG tablet Take 150 mg by mouth daily as needed for heartburn.  Marland Kitchen albuterol (PROVENTIL HFA;VENTOLIN HFA) 108 (90 Base) MCG/ACT inhaler Inhale into the lungs.  . ciprofloxacin (CIPRO) 500 MG tablet Take 1 tablet (500 mg total) by mouth 2 (two) times daily.  . fluconazole (DIFLUCAN) 150 MG  tablet Take 1 tablet po once. May repeat dose in 3 days as needed for persistent symptoms.  . phenazopyridine (PYRIDIUM) 200 MG tablet Take 1 tablet (200 mg total) by mouth 3 (three) times daily as needed for pain.  . [DISCONTINUED] amoxicillin-clavulanate (AUGMENTIN) 875-125 MG tablet    No facility-administered encounter medications on file as of 08/20/2018.       Medical History: Past Medical History:  Diagnosis Date  . Anxiety   . Depression   . GERD (gastroesophageal reflux disease)      Today's Vitals   08/20/18 1507  BP: (!) 151/72  Pulse: 68  Resp: 16  Temp: (!) 97.4 F (36.3 C)  SpO2: 95%  Weight: 152 lb (68.9 kg)  Height: 5\' 2"  (1.575 m)    Review of Systems  Constitutional: Negative for activity change, chills, fatigue and unexpected weight change.  HENT: Negative for congestion, postnasal drip, rhinorrhea, sneezing, sore throat and voice change.   Eyes: Negative.  Negative for redness.  Respiratory: Negative for cough, chest tightness, shortness of breath and wheezing.   Cardiovascular: Negative for chest pain and palpitations.       Elevated blood pressure   Gastrointestinal: Negative for abdominal pain, constipation, diarrhea, nausea and vomiting.  Endocrine: Negative for cold intolerance, heat intolerance, polydipsia, polyphagia and polyuria.  Genitourinary: Positive for difficulty urinating, dysuria, flank pain, frequency, hematuria, pelvic pain and urgency.  Musculoskeletal: Negative for arthralgias, back pain, joint swelling and neck pain.  Skin: Negative for rash.  Allergic/Immunologic: Negative for environmental allergies.  Neurological: Negative for dizziness, tremors and numbness.  Hematological: Negative for adenopathy. Does not bruise/bleed easily.  Psychiatric/Behavioral: Positive for dysphoric mood. Negative for behavioral problems (Depression), sleep disturbance and suicidal ideas. The patient is nervous/anxious.     Physical Exam   Constitutional: She is oriented to person, place, and time. She appears well-developed and well-nourished. No distress.  HENT:  Head: Normocephalic and atraumatic.  Nose: Nose normal.  Mouth/Throat: Oropharynx is clear and moist. No oropharyngeal exudate.  Eyes: Pupils are equal, round, and reactive to light. Conjunctivae and EOM are normal.  Neck: Normal range of motion. Neck supple. No JVD present. No tracheal deviation present. No thyromegaly present.  Cardiovascular: Normal rate, regular rhythm and normal heart sounds. Exam reveals no gallop and no friction rub.  No murmur heard. Pulmonary/Chest: Effort normal and breath sounds normal. No respiratory distress. She has no wheezes. She has no rales. She exhibits no tenderness.  Abdominal: Soft. Bowel sounds are normal. There is tenderness.  Suprapubic tenderness present with palpation.  Genitourinary:  Genitourinary Comments: U/a was positive for moderate blood.   Musculoskeletal: Normal range of motion.  Lymphadenopathy:    She has no cervical adenopathy.  Neurological: She is alert and oriented to person, place, and time. No cranial nerve deficit.  Skin: Skin is warm and dry. Capillary refill takes less than 2 seconds. She is not diaphoretic.  Psychiatric: Her speech is normal and behavior is normal. Judgment and thought content normal. Her mood appears anxious. Cognition and memory are normal.  Nursing note and vitals reviewed.  Assessment/Plan: 1. Urinary tract infection with hematuria, site unspecified Start cipro 500mg  bid for 10 days. Send urine for culture and sensitivity and adjust antibiotics as indicated.  - CULTURE, URINE COMPREHENSIVE - ciprofloxacin (CIPRO) 500 MG tablet; Take 1 tablet (500 mg total) by mouth 2 (two) times daily.  Dispense: 20 tablet; Refill: 0  2. Vaginal candidiasis Take 1 tablet once. May repeat dose in 3 days for persistent symptoms.  - fluconazole (DIFLUCAN) 150 MG tablet; Take 1 tablet po once.  May repeat dose in 3 days as needed for persistent symptoms.  Dispense: 3 tablet; Refill: 0  3. Encounter for assessment of STD exposure Will check urine for exposure to STD and will treat as indicated  - Chlamydia/Gonococcus/Trichomonas, NAA  4. Elevated blood pressure reading without diagnosis of hypertension Generally stable. Will continue to monitor.  5. Dysuria - POCT Urinalysis Dipstick - phenazopyridine (PYRIDIUM) 200 MG tablet; Take 1 tablet (200 mg total) by mouth 3 (three) times daily as needed for pain.  Dispense: 10 tablet; Refill: 0  General Counseling: Jodelle verbalizes understanding of the findings of todays visit and agrees with plan of treatment. I have discussed any further diagnostic evaluation that may be needed or ordered today. We also reviewed her medications today. she has been encouraged to call the office with any questions or concerns that should arise related to todays visit.    Counseling:  Rest and increase fluids. Continue using OTC medication to control symptoms.   This patient was seen by Vincent Gros FNP Collaboration with Dr Lyndon Code as a part of collaborative care agreement  Orders Placed This Encounter  Procedures  . CULTURE, URINE COMPREHENSIVE  . Chlamydia/Gonococcus/Trichomonas, NAA  . POCT Urinalysis Dipstick    Meds ordered this encounter  Medications  . ciprofloxacin (CIPRO) 500 MG tablet    Sig: Take 1 tablet (500 mg total) by mouth 2 (two) times  daily.    Dispense:  20 tablet    Refill:  0    Order Specific Question:   Supervising Provider    Answer:   Lyndon Code [[id number]]  . phenazopyridine (PYRIDIUM) 200 MG tablet    Sig: Take 1 tablet (200 mg total) by mouth 3 (three) times daily as needed for pain.    Dispense:  10 tablet    Refill:  0    Order Specific Question:   Supervising Provider    Answer:   Lyndon Code [[id number]]  . fluconazole (DIFLUCAN) 150 MG tablet    Sig: Take 1 tablet po once. May repeat dose in 3 days as  needed for persistent symptoms.    Dispense:  3 tablet    Refill:  0    Order Specific Question:   Supervising Provider    Answer:   Lyndon Code [[id number]]    Time spent: 15 Minutes

## 2018-08-21 DIAGNOSIS — B373 Candidiasis of vulva and vagina: Secondary | ICD-10-CM | POA: Insufficient documentation

## 2018-08-21 DIAGNOSIS — N390 Urinary tract infection, site not specified: Secondary | ICD-10-CM | POA: Insufficient documentation

## 2018-08-21 DIAGNOSIS — R030 Elevated blood-pressure reading, without diagnosis of hypertension: Secondary | ICD-10-CM | POA: Insufficient documentation

## 2018-08-21 DIAGNOSIS — Z0001 Encounter for general adult medical examination with abnormal findings: Secondary | ICD-10-CM

## 2018-08-21 DIAGNOSIS — R319 Hematuria, unspecified: Secondary | ICD-10-CM

## 2018-08-21 DIAGNOSIS — R300 Dysuria: Secondary | ICD-10-CM | POA: Insufficient documentation

## 2018-08-21 DIAGNOSIS — B3731 Acute candidiasis of vulva and vagina: Secondary | ICD-10-CM | POA: Insufficient documentation

## 2018-08-21 DIAGNOSIS — Z79899 Other long term (current) drug therapy: Secondary | ICD-10-CM | POA: Insufficient documentation

## 2018-08-22 LAB — CULTURE, URINE COMPREHENSIVE

## 2018-08-22 LAB — CHLAMYDIA/GONOCOCCUS/TRICHOMONAS, NAA
Chlamydia by NAA: NEGATIVE
Gonococcus by NAA: NEGATIVE
TRICH VAG BY NAA: NEGATIVE

## 2018-10-16 ENCOUNTER — Ambulatory Visit (INDEPENDENT_AMBULATORY_CARE_PROVIDER_SITE_OTHER): Payer: BLUE CROSS/BLUE SHIELD | Admitting: Nurse Practitioner

## 2018-10-16 ENCOUNTER — Encounter: Payer: Self-pay | Admitting: Nurse Practitioner

## 2018-10-16 VITALS — BP 138/64 | HR 73 | Resp 16 | Ht 62.0 in | Wt 156.2 lb

## 2018-10-16 DIAGNOSIS — N390 Urinary tract infection, site not specified: Secondary | ICD-10-CM | POA: Diagnosis not present

## 2018-10-16 DIAGNOSIS — R319 Hematuria, unspecified: Secondary | ICD-10-CM

## 2018-10-16 DIAGNOSIS — R300 Dysuria: Secondary | ICD-10-CM

## 2018-10-16 DIAGNOSIS — F411 Generalized anxiety disorder: Secondary | ICD-10-CM

## 2018-10-16 DIAGNOSIS — F17210 Nicotine dependence, cigarettes, uncomplicated: Secondary | ICD-10-CM | POA: Insufficient documentation

## 2018-10-16 DIAGNOSIS — F419 Anxiety disorder, unspecified: Secondary | ICD-10-CM | POA: Insufficient documentation

## 2018-10-16 DIAGNOSIS — Z720 Tobacco use: Secondary | ICD-10-CM | POA: Insufficient documentation

## 2018-10-16 DIAGNOSIS — F3341 Major depressive disorder, recurrent, in partial remission: Secondary | ICD-10-CM

## 2018-10-16 DIAGNOSIS — Z0001 Encounter for general adult medical examination with abnormal findings: Secondary | ICD-10-CM

## 2018-10-16 MED ORDER — ALPRAZOLAM 0.5 MG PO TABS: 0.5000 mg | ORAL_TABLET | Freq: Two times a day (BID) | ORAL | 3 refills | Status: DC | PRN

## 2018-10-16 MED ORDER — CITALOPRAM HYDROBROMIDE 20 MG PO TABS: 20.0000 mg | ORAL_TABLET | Freq: Two times a day (BID) | ORAL | 3 refills | Status: DC

## 2018-10-16 NOTE — Progress Notes (Signed)
[facility] [address] [place], [place] [address]  Internal MEDICINE  Office Visit Note  Patient Name: Felicia Blackwell  [id number]  [id number]  Date of Service: 10/16/2018    Pt is here for routine health maintenance examination  Chief Complaint  Patient presents with  . Annual Exam    pt had a mammogram done recently  . Quality Metric Gaps    pt will get flu vaccine from pharmacy     The patient is here for routine health maintenance exam. Recently had her mammogram which was normal. She is due for flu shot and will get this at her pharmacy the next time she goes. Today, she has no concerns or complaints. She needs to have refills for her citalopram and alprazolam. Her anxiety and depression are well controled on these medications and she sleeps well.  She did have urinary tract infection at her last visit. She finished antibiotics and took diflucan for yeast infection. Her symptoms have resolved. Urine sample will be sent to the lab today. Will treat any residual infection.    Current Medication: Outpatient Encounter Medications as of 10/16/2018  Medication Sig  . ALPRAZolam (XANAX) 0.5 MG tablet Take 1 tablet (0.5 mg total) by mouth 2 (two) times daily as needed for anxiety.  . citalopram (CELEXA) 20 MG tablet Take 1 tablet (20 mg total) by mouth 2 (two) times daily.  . [DISCONTINUED] ALPRAZolam (XANAX) 0.5 MG tablet Take 1 tablet (0.5 mg total) by mouth 2 (two) times daily as needed for anxiety.  . [DISCONTINUED] citalopram (CELEXA) 20 MG tablet Take 1 tablet (20 mg total) by mouth 2 (two) times daily.  Marland Kitchen albuterol (PROVENTIL HFA;VENTOLIN HFA) 108 (90 Base) MCG/ACT inhaler Inhale into the lungs.  . ciprofloxacin (CIPRO) 500 MG tablet Take 1 tablet (500 mg total) by mouth 2 (two) times daily. (Patient not taking: Reported on 10/16/2018)  . fluconazole (DIFLUCAN) 150 MG tablet Take 1 tablet po once. May repeat dose in 3 days as needed for persistent symptoms.  (Patient not taking: Reported on 10/16/2018)  . phenazopyridine (PYRIDIUM) 200 MG tablet Take 1 tablet (200 mg total) by mouth 3 (three) times daily as needed for pain. (Patient not taking: Reported on 10/16/2018)  . ranitidine (ZANTAC) 150 MG tablet Take 150 mg by mouth daily as needed for heartburn.   No facility-administered encounter medications on file as of 10/16/2018.     Surgical History: Past Surgical History:  Procedure Laterality Date  . TUBAL LIGATION      Medical History: Past Medical History:  Diagnosis Date  . Anxiety   . Depression   . GERD (gastroesophageal reflux disease)     Family History: Family History  Family history unknown: Yes      Review of Systems  Constitutional: Negative for activity change, chills, fatigue and unexpected weight change.  HENT: Negative for congestion, postnasal drip, rhinorrhea, sneezing and voice change.   Eyes: Negative.  Negative for redness.  Respiratory: Negative for cough, chest tightness, shortness of breath and wheezing.   Cardiovascular: Negative for chest pain and palpitations.  Gastrointestinal: Negative for abdominal pain, constipation, diarrhea, nausea and vomiting.  Endocrine: Negative for cold intolerance, heat intolerance, polydipsia, polyphagia and polyuria.  Genitourinary: Negative for dysuria and frequency.  Musculoskeletal: Negative for arthralgias, back pain, joint swelling and neck pain.  Skin: Negative for rash.  Allergic/Immunologic: Positive for environmental allergies.  Neurological: Positive for headaches. Negative for dizziness, tremors, weakness and numbness.  Hematological: Negative for adenopathy.  Does not bruise/bleed easily.  Psychiatric/Behavioral: Positive for dysphoric mood. Negative for behavioral problems (Depression), sleep disturbance and suicidal ideas. The patient is nervous/anxious.      Vital Signs: BP 138/64 (BP Location: Right Arm, Patient Position: Sitting, Cuff Size: Large)    Pulse 73   Resp 16   Ht 5\' 2"  (1.575 m)   Wt 156 lb 3.2 oz (70.9 kg)   SpO2 97%   BMI 28.57 kg/m    Physical Exam  Constitutional: She is oriented to person, place, and time. She appears well-developed and well-nourished. No distress.  HENT:  Head: Normocephalic and atraumatic.  Nose: Nose normal.  Mouth/Throat: No oropharyngeal exudate.  Eyes: Pupils are equal, round, and reactive to light. Conjunctivae and EOM are normal.  Neck: Normal range of motion. Neck supple. No JVD present. Carotid bruit is not present. No tracheal deviation present. No thyromegaly present.  Cardiovascular: Normal rate, regular rhythm, normal heart sounds and intact distal pulses. Exam reveals no gallop and no friction rub.  No murmur heard. Pulmonary/Chest: Effort normal and breath sounds normal. No respiratory distress. She has no wheezes. She has no rales. She exhibits no tenderness.  Abdominal: Soft. Bowel sounds are normal. There is no tenderness.  Musculoskeletal: Normal range of motion.  Lymphadenopathy:    She has no cervical adenopathy.  Neurological: She is alert and oriented to person, place, and time. No cranial nerve deficit.  Skin: Skin is warm and dry. She is not diaphoretic.  Psychiatric: Her behavior is normal. Judgment and thought content normal. Her mood appears anxious. Cognition and memory are normal.  Nursing note and vitals reviewed.    LABS: Recent Results (from the past 2160 hour(s))  Chlamydia/Gonococcus/Trichomonas, NAA     Status: None   Collection Time: 08/20/18  3:16 PM  Result Value Ref Range   Chlamydia by NAA Negative Negative   Gonococcus by NAA Negative Negative   Trich vag by NAA Negative Negative  POCT Urinalysis Dipstick     Status: None   Collection Time: 08/20/18  3:19 PM  Result Value Ref Range   Color, UA     Clarity, UA     Glucose, UA Negative Negative   Bilirubin, UA negative    Ketones, UA negative    Spec Grav, UA 1.010 1.010 - 1.025   Blood, UA  moderate    pH, UA 6.0 5.0 - 8.0   Protein, UA Negative Negative   Urobilinogen, UA 0.2 0.2 or 1.0 E.U./dL   Nitrite, UA negative    Leukocytes, UA Negative Negative   Appearance     Odor    CULTURE, URINE COMPREHENSIVE     Status: None   Collection Time: 08/20/18  3:19 PM  Result Value Ref Range   Urine Culture, Comprehensive Final report    Organism ID, Bacteria Comment     Comment: Mixed urogenital flora 10,000-25,000 colony forming units per mL    Assessment/Plan: 1. Encounter for general adult medical examination with abnormal findings Annual health maintenance exam today  2. Recurrent major depressive disorder, in partial remission (HCC) Well managed. Continue citalopram as prescribed. Refills sent to her pharmacy today.  3. Generalized anxiety disorder May continue to take alprazolam 0.5mg  twice daily as needed. New prescription provided today.  - ALPRAZolam (XANAX) 0.5 MG tablet; Take 1 tablet (0.5 mg total) by mouth 2 (two) times daily as needed for anxiety.  Dispense: 60 tablet; Refill: 3 - citalopram (CELEXA) 20 MG tablet; Take 1 tablet (20 mg  total) by mouth 2 (two) times daily.  Dispense: 60 tablet; Refill: 3  4. Urinary tract infection with hematuria, site unspecified Symptoms resolved. Urine sample sent to lab and will treat residual infection if needed   5. Dysuria - UA/M w/rflx Culture, Routine  6. Nicotine dependence - cigarettes  Smoking cessation discussed.   General Counseling: Felicia Blackwell verbalizes understanding of the findings of todays visit and agrees with plan of treatment. I have discussed any further diagnostic evaluation that may be needed or ordered today. We also reviewed her medications today. she has been encouraged to call the office with any questions or concerns that should arise related to todays visit.    Counseling:  Smoking cessation counseling: 1. Pt acknowledges the risks of long term smoking, she will try to quite  smoking. 2. Options for different medications including nicotine products, chewing gum, patch etc, Wellbutrin and Chantix is discussed 3. Goal and date of compete cessation is discussed Total time spent in smoking cessation is 15 min.  This patient was seen by Vincent GrosHeather Tyqwan Pink FNP Collaboration with Dr Lyndon CodeFozia M Khan as a part of collaborative care agreement  Orders Placed This Encounter  Procedures  . UA/M w/rflx Culture, Routine    Meds ordered this encounter  Medications  . ALPRAZolam (XANAX) 0.5 MG tablet    Sig: Take 1 tablet (0.5 mg total) by mouth 2 (two) times daily as needed for anxiety.    Dispense:  60 tablet    Refill:  3    Please add refills to current prescription. Not ready for refill at this time.    Order Specific Question:   Supervising Provider    Answer:   Lyndon CodeKHAN, FOZIA M [[id number]]  . citalopram (CELEXA) 20 MG tablet    Sig: Take 1 tablet (20 mg total) by mouth 2 (two) times daily.    Dispense:  60 tablet    Refill:  3    Order Specific Question:   Supervising Provider    Answer:   Lyndon CodeKHAN, FOZIA M [[id number]]    Time spent: 6830 Minutes      Lyndon CodeFozia M Khan, MD  Internal Medicine

## 2018-10-17 LAB — MICROSCOPIC EXAMINATION
Bacteria, UA: NONE SEEN
Casts: NONE SEEN /lpf

## 2018-10-17 LAB — UA/M W/RFLX CULTURE, ROUTINE
BILIRUBIN UA: NEGATIVE
Glucose, UA: NEGATIVE
KETONES UA: NEGATIVE
Leukocytes, UA: NEGATIVE
NITRITE UA: NEGATIVE
Protein, UA: NEGATIVE
SPEC GRAV UA: 1.017 (ref 1.005–1.030)
Urobilinogen, Ur: 0.2 mg/dL (ref 0.2–1.0)
pH, UA: 5 (ref 5.0–7.5)

## 2018-10-29 ENCOUNTER — Other Ambulatory Visit: Payer: Self-pay | Admitting: Nurse Practitioner

## 2018-10-29 DIAGNOSIS — J0140 Acute pansinusitis, unspecified: Secondary | ICD-10-CM

## 2018-10-29 MED ORDER — FLUTICASONE PROPIONATE 50 MCG/ACT NA SUSP: 2.0000 | Freq: Every day | NASAL | 6 refills | Status: DC

## 2018-10-29 MED ORDER — CEFUROXIME AXETIL 500 MG PO TABS: 500.0000 mg | ORAL_TABLET | Freq: Two times a day (BID) | ORAL | 0 refills | Status: DC

## 2018-10-29 NOTE — Progress Notes (Signed)
Patient called forsinus ifnection. Sent in ceftin 500mg  bid for 10 days and rx for flonase. Use 1 to 2 sprays in both nostrils daily as needed. Recommend OTC medications to alleviate symptoms.

## 2019-02-12 ENCOUNTER — Ambulatory Visit: Payer: Self-pay | Admitting: Nurse Practitioner

## 2019-02-28 ENCOUNTER — Other Ambulatory Visit: Payer: Self-pay

## 2019-02-28 DIAGNOSIS — F411 Generalized anxiety disorder: Secondary | ICD-10-CM

## 2019-02-28 MED ORDER — ALPRAZOLAM 0.5 MG PO TABS: 0.5000 mg | ORAL_TABLET | Freq: Two times a day (BID) | ORAL | 0 refills | Status: DC | PRN

## 2019-02-28 MED ORDER — CITALOPRAM HYDROBROMIDE 20 MG PO TABS: 20.0000 mg | ORAL_TABLET | Freq: Two times a day (BID) | ORAL | 0 refills | Status: DC

## 2019-02-28 NOTE — Telephone Encounter (Signed)
Called in phar xanax 0.5 take 1 tab po twice a day as needed #45 with no refills as per dfk and also lmom with pt need keep her follow up

## 2019-03-15 ENCOUNTER — Ambulatory Visit (INDEPENDENT_AMBULATORY_CARE_PROVIDER_SITE_OTHER): Payer: BLUE CROSS/BLUE SHIELD | Admitting: Nurse Practitioner

## 2019-03-15 ENCOUNTER — Other Ambulatory Visit: Payer: Self-pay

## 2019-03-15 ENCOUNTER — Encounter: Payer: Self-pay | Admitting: Nurse Practitioner

## 2019-03-15 VITALS — Ht 62.0 in | Wt 147.0 lb

## 2019-03-15 DIAGNOSIS — F3341 Major depressive disorder, recurrent, in partial remission: Secondary | ICD-10-CM

## 2019-03-15 DIAGNOSIS — F411 Generalized anxiety disorder: Secondary | ICD-10-CM | POA: Diagnosis not present

## 2019-03-15 DIAGNOSIS — K219 Gastro-esophageal reflux disease without esophagitis: Secondary | ICD-10-CM | POA: Diagnosis not present

## 2019-03-15 MED ORDER — FAMOTIDINE 20 MG PO TABS: 20.0000 mg | ORAL_TABLET | Freq: Two times a day (BID) | ORAL | 3 refills | Status: DC

## 2019-03-15 MED ORDER — CITALOPRAM HYDROBROMIDE 20 MG PO TABS: 20.0000 mg | ORAL_TABLET | Freq: Two times a day (BID) | ORAL | 3 refills | Status: DC

## 2019-03-15 MED ORDER — ALPRAZOLAM 0.5 MG PO TABS: 0.5000 mg | ORAL_TABLET | Freq: Two times a day (BID) | ORAL | 3 refills | Status: DC | PRN

## 2019-03-15 NOTE — Progress Notes (Signed)
[facility] [address] [place], [place] [address]  Internal MEDICINE  Telephone Visit  Patient Name: Felicia Blackwell  [id number]  [id number]  Date of Service: 04/06/2019  I connected with the patient at 3:01pm by webcam and verified the patients identity using two identifiers.   I discussed the limitations, risks, security and privacy concerns of performing an evaluation and management service by webcam and the availability of in person appointments. I also discussed with the patient that there may be a patient responsible charge related to the service.  The patient expressed understanding and agrees to proceed.    Chief Complaint  Patient presents with  . Telephone Assessment  . Telephone Screen  . Medication Refill    The patient has been contacted via webcam for follow up visit due to concerns for spread of novel coronavirus.  Today, she has no concerns or complaints. She needs to have refills for her citalopram and alprazolam. Her anxiety and depression are well controled on these medications and she sleeps well.      Current Medication: Outpatient Encounter Medications as of 03/15/2019  Medication Sig  . ALPRAZolam (XANAX) 0.5 MG tablet Take 1 tablet (0.5 mg total) by mouth 2 (two) times daily as needed for anxiety.  . citalopram (CELEXA) 20 MG tablet Take 1 tablet (20 mg total) by mouth 2 (two) times daily.  . fluticasone (FLONASE) 50 MCG/ACT nasal spray Place 2 sprays into both nostrils daily.  . ranitidine (ZANTAC) 150 MG tablet Take 150 mg by mouth daily as needed for heartburn.  . [DISCONTINUED] ALPRAZolam (XANAX) 0.5 MG tablet Take 1 tablet (0.5 mg total) by mouth 2 (two) times daily as needed for anxiety.  . [DISCONTINUED] citalopram (CELEXA) 20 MG tablet Take 1 tablet (20 mg total) by mouth 2 (two) times daily.  Marland Kitchen. albuterol (PROVENTIL HFA;VENTOLIN HFA) 108 (90 Base) MCG/ACT inhaler Inhale into the lungs.  . famotidine (PEPCID) 20 MG tablet Take 1 tablet  (20 mg total) by mouth 2 (two) times daily.  . fluconazole (DIFLUCAN) 150 MG tablet Take 1 tablet po once. May repeat dose in 3 days as needed for persistent symptoms. (Patient not taking: Reported on 10/16/2018)  . [DISCONTINUED] cefUROXime (CEFTIN) 500 MG tablet Take 1 tablet (500 mg total) by mouth 2 (two) times daily with a meal.  . [DISCONTINUED] phenazopyridine (PYRIDIUM) 200 MG tablet Take 1 tablet (200 mg total) by mouth 3 (three) times daily as needed for pain. (Patient not taking: Reported on 10/16/2018)   No facility-administered encounter medications on file as of 03/15/2019.     Surgical History: Past Surgical History:  Procedure Laterality Date  . TUBAL LIGATION      Medical History: Past Medical History:  Diagnosis Date  . Anxiety   . Depression   . GERD (gastroesophageal reflux disease)     Family History: Family History  Family history unknown: Yes    Social History   Socioeconomic History  . Marital status: Married    Spouse name: Not on file  . Number of children: Not on file  . Years of education: Not on file  . Highest education level: Not on file  Occupational History  . Not on file  Social Needs  . Financial resource strain: Not on file  . Food insecurity:    Worry: Not on file    Inability: Not on file  . Transportation needs:    Medical: Not on file    Non-medical: Not on file  Tobacco  Use  . Smoking status: Current Every Day Smoker    Packs/day: 1.00    Types: Cigarettes  . Smokeless tobacco: Never Used  Substance and Sexual Activity  . Alcohol use: Never    Frequency: Never  . Drug use: Never  . Sexual activity: Not on file  Lifestyle  . Physical activity:    Days per week: Not on file    Minutes per session: Not on file  . Stress: Not on file  Relationships  . Social connections:    Talks on phone: Not on file    Gets together: Not on file    Attends religious service: Not on file    Active member of club or organization: Not  on file    Attends meetings of clubs or organizations: Not on file    Relationship status: Not on file  . Intimate partner violence:    Fear of current or ex partner: Not on file    Emotionally abused: Not on file    Physically abused: Not on file    Forced sexual activity: Not on file  Other Topics Concern  . Not on file  Social History Narrative  . Not on file      Review of Systems  Constitutional: Negative for activity change, chills, fatigue and unexpected weight change.  HENT: Negative for congestion, postnasal drip, rhinorrhea, sneezing and voice change.   Respiratory: Negative for cough, chest tightness, shortness of breath and wheezing.   Cardiovascular: Negative for chest pain and palpitations.  Gastrointestinal: Negative for abdominal pain, constipation, diarrhea, nausea and vomiting.  Endocrine: Negative for cold intolerance, heat intolerance, polydipsia and polyuria.  Musculoskeletal: Negative for arthralgias, back pain, joint swelling and neck pain.  Skin: Negative for rash.  Allergic/Immunologic: Positive for environmental allergies.  Neurological: Positive for headaches. Negative for dizziness, tremors, weakness and numbness.  Hematological: Negative for adenopathy. Does not bruise/bleed easily.  Psychiatric/Behavioral: Positive for dysphoric mood. Negative for behavioral problems (Depression), sleep disturbance and suicidal ideas. The patient is nervous/anxious.        Well controlled with current medication.    Today's Vitals   03/15/19 1442  Weight: 147 lb (66.7 kg)  Height:  (1.575 m)   Body mass index is 26.89 kg/m.  Observation/Objective:   The patient is alert and oriented. She is pleasant and answers all questions appropriately. Breathing is non-labored. She is in no acute distress at this time.    Assessment/Plan: 1. Gastroesophageal reflux disease without esophagitis Changed zantac to pepcid  twice daily as needed for reflux symptoms.   - famotidine (PEPCID) 20 MG tablet; Take 1 tablet (20 mg total) by mouth 2 (two) times daily.  Dispense: 60 tablet; Refill: 3  2. Generalized anxiety disorder Stable. Continue citalopram  twice daily. May continue alprazolam twice daily as needed for acute anxiety. - ALPRAZolam (XANAX) 0.5 MG tablet; Take 1 tablet (0.5 mg total) by mouth 2 (two) times daily as needed for anxiety.  Dispense: 60 tablet; Refill: 3 - citalopram (CELEXA) 20 MG tablet; Take 1 tablet (20 mg total) by mouth 2 (two) times daily.  Dispense: 60 tablet; Refill: 3  3. Recurrent major depressive disorder, in partial remission (HCC) Stable. Continue citalopram  twice daily.   General Counseling: tykesha konicki understanding of the findings of today's phone visit and agrees with plan of treatment. I have discussed any further diagnostic evaluation that may be needed or ordered today. We also reviewed her medications today. she has been encouraged to  call the office with any questions or concerns that should arise related to todays visit.  This patient was seen by Vincent Gros FNP Collaboration with Dr Lyndon Code as a part of collaborative care agreement  Meds ordered this encounter  Medications  . ALPRAZolam (XANAX) 0.5 MG tablet    Sig: Take 1 tablet (0.5 mg total) by mouth 2 (two) times daily as needed for anxiety.    Dispense:  60 tablet    Refill:  3    Order Specific Question:   Supervising Provider    Answer:   Lyndon Code [[id number]]  . citalopram (CELEXA) 20 MG tablet    Sig: Take 1 tablet (20 mg total) by mouth 2 (two) times daily.    Dispense:  60 tablet    Refill:  3    Order Specific Question:   Supervising Provider    Answer:   Lyndon Code [[id number]]  . famotidine (PEPCID) 20 MG tablet    Sig: Take 1 tablet (20 mg total) by mouth 2 (two) times daily.    Dispense:  60 tablet    Refill:  3    Switch from rantidine since this is off the market.    Order Specific Question:   Supervising Provider     Answer:   Lyndon Code [[id number]]    Time spent: 69 Minutes    Dr Lyndon Code Internal medicine

## 2019-03-31 ENCOUNTER — Other Ambulatory Visit: Payer: Self-pay | Admitting: Internal Medicine

## 2019-03-31 DIAGNOSIS — F411 Generalized anxiety disorder: Secondary | ICD-10-CM

## 2019-07-19 ENCOUNTER — Ambulatory Visit: Payer: BLUE CROSS/BLUE SHIELD | Admitting: Nurse Practitioner

## 2019-07-19 ENCOUNTER — Encounter: Payer: Self-pay | Admitting: Nurse Practitioner

## 2019-07-19 VITALS — BP 125/58 | HR 71 | Resp 16 | Ht 62.0 in | Wt 147.8 lb

## 2019-07-19 DIAGNOSIS — Z79899 Other long term (current) drug therapy: Secondary | ICD-10-CM

## 2019-07-19 DIAGNOSIS — R300 Dysuria: Secondary | ICD-10-CM

## 2019-07-19 DIAGNOSIS — F411 Generalized anxiety disorder: Secondary | ICD-10-CM

## 2019-07-19 DIAGNOSIS — H109 Unspecified conjunctivitis: Secondary | ICD-10-CM | POA: Diagnosis not present

## 2019-07-19 LAB — POCT URINE DRUG SCREEN
POC Amphetamine UR: NOT DETECTED
POC BENZODIAZEPINES UR: POSITIVE — AB
POC Barbiturate UR: NOT DETECTED
POC Cocaine UR: NOT DETECTED
POC Ecstasy UR: NOT DETECTED
POC Marijuana UR: NOT DETECTED
POC Methadone UR: NOT DETECTED
POC Methamphetamine UR: NOT DETECTED
POC Opiate Ur: NOT DETECTED
POC Oxycodone UR: NOT DETECTED
POC PHENCYCLIDINE UR: NOT DETECTED
POC TRICYCLICS UR: NOT DETECTED

## 2019-07-19 MED ORDER — POLYMYXIN B-TRIMETHOPRIM 10000-0.1 UNIT/ML-% OP SOLN: 1.0000 [drp] | OPHTHALMIC | 0 refills | Status: DC

## 2019-07-19 MED ORDER — ALPRAZOLAM 0.5 MG PO TABS: 0.5000 mg | ORAL_TABLET | Freq: Two times a day (BID) | ORAL | 3 refills | Status: DC | PRN

## 2019-07-19 MED ORDER — CITALOPRAM HYDROBROMIDE 20 MG PO TABS: 20.0000 mg | ORAL_TABLET | Freq: Two times a day (BID) | ORAL | 3 refills | Status: DC

## 2019-07-19 NOTE — Progress Notes (Signed)
[facility] [address] [place], [place] [address]  Internal MEDICINE  Office Visit Note  Patient Name: Felicia Blackwell  [id number]  [id number]  Date of Service: 07/19/2019  Chief Complaint  Patient presents with  . Gastroesophageal Reflux  . Anxiety  . Depression  . Itchy Eye    right eye is red and irritated since yesterday    The patient is here for routine follow up. She is complaining of redness and itching of the right eye. Irritation is in the middle corner of of the eye and upper eyelid. She states that she had glitter on the eyelid yesterday, and feels like she might have wiped some into her eye.  The has well controlled anxiety and depression. Takes citalopram daily. alo takes alprazolam as needed and as prescribed. She needs to have refills for her citalopram and alprazolam. Her anxiety and depression are well controled on these medications and she sleeps well.       Current Medication: Outpatient Encounter Medications as of 07/19/2019  Medication Sig  . ALPRAZolam (XANAX) 0.5 MG tablet Take 1 tablet (0.5 mg total) by mouth 2 (two) times daily as needed for anxiety.  . citalopram (CELEXA) 20 MG tablet Take 1 tablet (20 mg total) by mouth 2 (two) times daily.  . famotidine (PEPCID) 20 MG tablet Take 1 tablet (20 mg total) by mouth 2 (two) times daily.  . ranitidine (ZANTAC) 150 MG tablet Take 150 mg by mouth daily as needed for heartburn.  . [DISCONTINUED] ALPRAZolam (XANAX) 0.5 MG tablet Take 1 tablet (0.5 mg total) by mouth 2 (two) times daily as needed for anxiety.  . [DISCONTINUED] citalopram (CELEXA) 20 MG tablet Take 1 tablet (20 mg total) by mouth 2 (two) times daily.  Marland Kitchen. albuterol (PROVENTIL HFA;VENTOLIN HFA) 108 (90 Base) MCG/ACT inhaler Inhale into the lungs.  . trimethoprim-polymyxin b (POLYTRIM) ophthalmic solution Place 1 drop into both eyes every 4 (four) hours.  . [DISCONTINUED] fluconazole (DIFLUCAN) 150 MG tablet Take 1 tablet po once. May  repeat dose in 3 days as needed for persistent symptoms. (Patient not taking: Reported on 10/16/2018)  . [DISCONTINUED] fluticasone (FLONASE) 50 MCG/ACT nasal spray Place 2 sprays into both nostrils daily. (Patient not taking: Reported on 07/19/2019)   No facility-administered encounter medications on file as of 07/19/2019.     Surgical History: Past Surgical History:  Procedure Laterality Date  . TUBAL LIGATION      Medical History: Past Medical History:  Diagnosis Date  . Anxiety   . Depression   . GERD (gastroesophageal reflux disease)     Family History: Family History  Family history unknown: Yes    Social History   Socioeconomic History  . Marital status: Married    Spouse name: Not on file  . Number of children: Not on file  . Years of education: Not on file  . Highest education level: Not on file  Occupational History  . Not on file  Social Needs  . Financial resource strain: Not on file  . Food insecurity    Worry: Not on file    Inability: Not on file  . Transportation needs    Medical: Not on file    Non-medical: Not on file  Tobacco Use  . Smoking status: Current Every Day Smoker    Packs/day: 1.00    Types: Cigarettes  . Smokeless tobacco: Never Used  Substance and Sexual Activity  . Alcohol use: Never    Frequency: Never  .  Drug use: Never  . Sexual activity: Not on file  Lifestyle  . Physical activity    Days per week: Not on file    Minutes per session: Not on file  . Stress: Not on file  Relationships  . Social Musician on phone: Not on file    Gets together: Not on file    Attends religious service: Not on file    Active member of club or organization: Not on file    Attends meetings of clubs or organizations: Not on file    Relationship status: Not on file  . Intimate partner violence    Fear of current or ex partner: Not on file    Emotionally abused: Not on file    Physically abused: Not on file    Forced sexual  activity: Not on file  Other Topics Concern  . Not on file  Social History Narrative  . Not on file      Review of Systems  Constitutional: Negative for activity change, chills, fatigue and unexpected weight change.  HENT: Negative for congestion, postnasal drip, rhinorrhea, sneezing and voice change.   Eyes: Positive for redness and itching.       Right eye irritation.   Respiratory: Negative for cough, chest tightness, shortness of breath and wheezing.   Cardiovascular: Negative for chest pain and palpitations.  Gastrointestinal: Negative for abdominal pain, constipation, diarrhea, nausea and vomiting.  Endocrine: Negative for cold intolerance, heat intolerance, polydipsia and polyuria.  Musculoskeletal: Negative for arthralgias, back pain, joint swelling and neck pain.  Skin: Negative for rash.  Allergic/Immunologic: Positive for environmental allergies.  Neurological: Positive for headaches. Negative for dizziness, tremors, weakness and numbness.  Hematological: Negative for adenopathy. Does not bruise/bleed easily.  Psychiatric/Behavioral: Positive for dysphoric mood. Negative for behavioral problems (Depression), sleep disturbance and suicidal ideas. The patient is nervous/anxious.        Well controlled with current medication.    Today's Vitals   07/19/19 1446  BP: (!) 125/58  Pulse: 71  Resp: 16  SpO2: 96%  Weight: 147 lb 12.8 oz (67 kg)  Height: 5\' 2"  (1.575 m)   Body mass index is 27.03 kg/m.  Physical Exam Vitals signs and nursing note reviewed.  Constitutional:      General: She is not in acute distress.    Appearance: Normal appearance. She is well-developed. She is not diaphoretic.  HENT:     Head: Normocephalic and atraumatic.     Nose: Nose normal.     Mouth/Throat:     Pharynx: No oropharyngeal exudate.  Eyes:     Conjunctiva/sclera:     Right eye: Exudate present.     Pupils: Pupils are equal, round, and reactive to light.      Comments: Right  eye is very red and inflamed with moderate amount of tearing present. Swelling is most severe in medial corner of the right eye.   Neck:     Musculoskeletal: Normal range of motion and neck supple.     Thyroid: No thyromegaly.     Vascular: No carotid bruit or JVD.     Trachea: No tracheal deviation.  Cardiovascular:     Rate and Rhythm: Normal rate and regular rhythm.     Heart sounds: Normal heart sounds. No murmur. No friction rub. No gallop.   Pulmonary:     Effort: Pulmonary effort is normal. No respiratory distress.     Breath sounds: Normal breath sounds. No wheezing or  rales.  Chest:     Chest wall: No tenderness.  Abdominal:     General: Bowel sounds are normal.     Palpations: Abdomen is soft.     Tenderness: There is no abdominal tenderness.  Musculoskeletal: Normal range of motion.  Lymphadenopathy:     Cervical: No cervical adenopathy.  Skin:    General: Skin is warm and dry.  Neurological:     Mental Status: She is alert and oriented to person, place, and time.     Cranial Nerves: No cranial nerve deficit.  Psychiatric:        Mood and Affect: Mood is anxious.        Behavior: Behavior normal.        Thought Content: Thought content normal.        Judgment: Judgment normal.    Assessment/Plan: 1. Bacterial conjunctivitis Start polytrim eye drops - use 1 drop in both eyes every four hours for next one week. Advised she see opthalmologist if symptoms persist through the weekend. Concern is for corneal abrasion.  - trimethoprim-polymyxin b (POLYTRIM) ophthalmic solution; Place 1 drop into both eyes every 4 (four) hours.  Dispense: 10 mL; Refill: 0  2. Generalized anxiety disorder Stable. Continue citalopram 20mg  twice daily. May take alprazolam 0.5mg  twice daily if needed for acute anxiety. A new prescription was sent to her pharmacy.  - citalopram (CELEXA) 20 MG tablet; Take 1 tablet (20 mg total) by mouth 2 (two) times daily.  Dispense: 60 tablet; Refill: 3 -  ALPRAZolam (XANAX) 0.5 MG tablet; Take 1 tablet (0.5 mg total) by mouth 2 (two) times daily as needed for anxiety.  Dispense: 60 tablet; Refill: 3  3. Encounter for long-term (current) use of medications  - POCT Urine Drug Screen appropriately positive for BZO only.   General Counseling: jolea dolle understanding of the findings of todays visit and agrees with plan of treatment. I have discussed any further diagnostic evaluation that may be needed or ordered today. We also reviewed her medications today. she has been encouraged to call the office with any questions or concerns that should arise related to todays visit.  This patient was seen by Leretha Pol FNP Collaboration with Dr Lavera Guise as a part of collaborative care agreement  Orders Placed This Encounter  Procedures  . POCT Urine Drug Screen    Meds ordered this encounter  Medications  . trimethoprim-polymyxin b (POLYTRIM) ophthalmic solution    Sig: Place 1 drop into both eyes every 4 (four) hours.    Dispense:  10 mL    Refill:  0    Order Specific Question:   Supervising Provider    Answer:   Lavera Guise [[id number]]  . citalopram (CELEXA) 20 MG tablet    Sig: Take 1 tablet (20 mg total) by mouth 2 (two) times daily.    Dispense:  60 tablet    Refill:  3    Order Specific Question:   Supervising Provider    Answer:   Lavera Guise [[id number]]  . ALPRAZolam (XANAX) 0.5 MG tablet    Sig: Take 1 tablet (0.5 mg total) by mouth 2 (two) times daily as needed for anxiety.    Dispense:  60 tablet    Refill:  3    Order Specific Question:   Supervising Provider    Answer:   Lavera Guise [[id number]]    Time spent: 4 Minutes      Dr Lavera Guise Internal medicine

## 2019-09-24 ENCOUNTER — Other Ambulatory Visit: Payer: Self-pay | Admitting: Nurse Practitioner

## 2019-09-24 DIAGNOSIS — F411 Generalized anxiety disorder: Secondary | ICD-10-CM

## 2019-09-24 MED ORDER — CITALOPRAM HYDROBROMIDE 20 MG PO TABS: 20.0000 mg | ORAL_TABLET | Freq: Two times a day (BID) | ORAL | 3 refills | Status: DC

## 2019-11-14 ENCOUNTER — Telehealth: Payer: Self-pay

## 2019-11-14 NOTE — Telephone Encounter (Signed)
Called lmom informing patient of appointment. klh 

## 2019-11-19 ENCOUNTER — Telehealth: Payer: Self-pay

## 2019-11-19 ENCOUNTER — Other Ambulatory Visit: Payer: Self-pay

## 2019-11-19 ENCOUNTER — Encounter (INDEPENDENT_AMBULATORY_CARE_PROVIDER_SITE_OTHER): Payer: Self-pay

## 2019-11-19 ENCOUNTER — Encounter: Payer: Self-pay | Admitting: Nurse Practitioner

## 2019-11-19 ENCOUNTER — Ambulatory Visit (INDEPENDENT_AMBULATORY_CARE_PROVIDER_SITE_OTHER): Payer: BLUE CROSS/BLUE SHIELD | Admitting: Nurse Practitioner

## 2019-11-19 VITALS — BP 145/75 | HR 67 | Resp 16 | Ht 63.0 in | Wt 147.6 lb

## 2019-11-19 DIAGNOSIS — Z0001 Encounter for general adult medical examination with abnormal findings: Secondary | ICD-10-CM | POA: Insufficient documentation

## 2019-11-19 DIAGNOSIS — N390 Urinary tract infection, site not specified: Secondary | ICD-10-CM

## 2019-11-19 DIAGNOSIS — F411 Generalized anxiety disorder: Secondary | ICD-10-CM | POA: Diagnosis not present

## 2019-11-19 DIAGNOSIS — Z23 Encounter for immunization: Secondary | ICD-10-CM | POA: Diagnosis not present

## 2019-11-19 DIAGNOSIS — F17210 Nicotine dependence, cigarettes, uncomplicated: Secondary | ICD-10-CM

## 2019-11-19 DIAGNOSIS — R300 Dysuria: Secondary | ICD-10-CM

## 2019-11-19 DIAGNOSIS — Z1159 Encounter for screening for other viral diseases: Secondary | ICD-10-CM | POA: Insufficient documentation

## 2019-11-19 DIAGNOSIS — R5383 Other fatigue: Secondary | ICD-10-CM

## 2019-11-19 DIAGNOSIS — R319 Hematuria, unspecified: Secondary | ICD-10-CM

## 2019-11-19 LAB — POCT URINALYSIS DIPSTICK
Bilirubin, UA: NEGATIVE
Glucose, UA: NEGATIVE
Ketones, UA: NEGATIVE
Leukocytes, UA: NEGATIVE
Nitrite, UA: NEGATIVE
Protein, UA: NEGATIVE
Spec Grav, UA: >=1.03 — AB (ref 1.010–1.025)
Urobilinogen, UA: 0.2 E.U./dL
pH, UA: 5 (ref 5.0–8.0)

## 2019-11-19 MED ORDER — CITALOPRAM HYDROBROMIDE 20 MG PO TABS: 20.0000 mg | ORAL_TABLET | Freq: Two times a day (BID) | ORAL | 3 refills | Status: DC

## 2019-11-19 MED ORDER — FLUCONAZOLE 150 MG PO TABS: 150.0000 mg | ORAL_TABLET | Freq: Once | ORAL | 0 refills | Status: AC

## 2019-11-19 MED ORDER — ALPRAZOLAM 0.5 MG PO TABS: 0.5000 mg | ORAL_TABLET | Freq: Two times a day (BID) | ORAL | 3 refills | Status: DC | PRN

## 2019-11-19 MED ORDER — SULFAMETHOXAZOLE-TRIMETHOPRIM 800-160 MG PO TABS: 1.0000 | ORAL_TABLET | Freq: Two times a day (BID) | ORAL | 0 refills | Status: DC

## 2019-11-19 NOTE — Progress Notes (Signed)
[facility], [place] [address]  Internal MEDICINE  Office Visit Note  Patient Name: Felicia Blackwell  [id number]  [id number]  Date of Service: 11/27/2019   Pt is here for routine health maintenance examination   Chief Complaint  Patient presents with  . Annual Exam  . Gastroesophageal Reflux  . Anxiety  . Depression    pt sister just got diagnosed with bone cancer has been feeling more depressed, loss of appetite, heart feels like it is racing   . Memory    pt is having issues remembering   . Urinary Tract Infection    pressure under abdominal/on groin area,      The patient is here for health maintenance exam. She states that she has been having severe anxiety. States that her sister just called her to let the patient know she has bone cancer. The patient just got this news about a week ago. The patient has felt a tightness in her chest every time she thinks about going to see her sister. She is afraid of losing another person after she lost her son a few years ago. She is also worried about driving to her sister's home. The patient is currently taking citalopram 20mg  once daily. Prescription is for twice daily and she has never increased the dose. She does take alprazolam as needed. Most of the time, she takes this only at night.  She states that she is having suprapubic pain and pressure in the bladder since Friday. She did take some AZO to help relieve the bladder. She states that the is uncomfortable and does not seem to be getting better.   Current Medication: Outpatient Encounter Medications as of 11/19/2019  Medication Sig  . ALPRAZolam (XANAX) 0.5 MG tablet Take 1 tablet (0.5 mg total) by mouth 2 (two) times daily as needed for anxiety.  . citalopram (CELEXA) 20 MG tablet Take 1 tablet (20 mg total) by mouth 2 (two) times daily.  . famotidine (PEPCID) 20 MG tablet Take 1 tablet (20 mg total) by mouth 2 (two) times daily.  . Multiple  Vitamins-Minerals (MULTIVITAMIN ADULT PO) Take by mouth daily.  . ranitidine (ZANTAC) 150 MG tablet Take 150 mg by mouth daily as needed for heartburn.  . trimethoprim-polymyxin b (POLYTRIM) ophthalmic solution Place 1 drop into both eyes every 4 (four) hours.  . [DISCONTINUED] ALPRAZolam (XANAX) 0.5 MG tablet Take 1 tablet (0.5 mg total) by mouth 2 (two) times daily as needed for anxiety.  . [DISCONTINUED] citalopram (CELEXA) 20 MG tablet Take 1 tablet (20 mg total) by mouth 2 (two) times daily.  Marland Kitchen albuterol (PROVENTIL HFA;VENTOLIN HFA) 108 (90 Base) MCG/ACT inhaler Inhale into the lungs.  . sulfamethoxazole-trimethoprim (BACTRIM DS) 800-160 MG tablet Take 1 tablet by mouth 2 (two) times daily.   No facility-administered encounter medications on file as of 11/19/2019.    Surgical History: Past Surgical History:  Procedure Laterality Date  . TUBAL LIGATION      Medical History: Past Medical History:  Diagnosis Date  . Anxiety   . Depression   . GERD (gastroesophageal reflux disease)     Family History: Family History  Family history unknown: Yes      Review of Systems  Constitutional: Negative for activity change, chills, fatigue and unexpected weight change.  HENT: Negative for congestion, postnasal drip, rhinorrhea, sneezing and voice change.   Respiratory: Negative for cough, chest tightness, shortness of breath and wheezing.   Cardiovascular: Negative for chest pain  and palpitations.  Gastrointestinal: Negative for abdominal pain, constipation, diarrhea, nausea and vomiting.       Abdominal hernia  Endocrine: Negative for cold intolerance, heat intolerance, polydipsia and polyuria.  Genitourinary: Positive for dysuria, frequency, hematuria and urgency.  Musculoskeletal: Negative for arthralgias, back pain, joint swelling and neck pain.  Skin: Negative for rash.  Allergic/Immunologic: Positive for environmental allergies.  Neurological: Positive for headaches. Negative  for dizziness, tremors, weakness and numbness.  Hematological: Negative for adenopathy. Does not bruise/bleed easily.  Psychiatric/Behavioral: Positive for dysphoric mood. Negative for behavioral problems (Depression), sleep disturbance and suicidal ideas. The patient is nervous/anxious.        Well controlled with current medication.     Today's Vitals   11/19/19 1421  BP: (!) 145/75  Pulse: 67  Resp: 16  SpO2: 96%  Weight: 147 lb 9.6 oz (67 kg)  Height: 5\' 3"  (1.6 m)   Body mass index is 26.15 kg/m.  Physical Exam Vitals and nursing note reviewed.  Constitutional:      General: She is not in acute distress.    Appearance: Normal appearance. She is well-developed. She is not diaphoretic.  HENT:     Head: Normocephalic and atraumatic.     Mouth/Throat:     Pharynx: No oropharyngeal exudate.  Eyes:     Pupils: Pupils are equal, round, and reactive to light.  Neck:     Thyroid: No thyromegaly.     Vascular: No carotid bruit or JVD.     Trachea: No tracheal deviation.  Cardiovascular:     Rate and Rhythm: Normal rate and regular rhythm.     Pulses: Normal pulses.     Heart sounds: Normal heart sounds. No murmur. No friction rub. No gallop.   Pulmonary:     Effort: Pulmonary effort is normal. No respiratory distress.     Breath sounds: Normal breath sounds. No wheezing or rales.  Chest:     Chest wall: No tenderness.     Breasts:        Right: Normal. No swelling, bleeding, inverted nipple, mass, nipple discharge, skin change or tenderness.        Left: Normal. No swelling, bleeding, inverted nipple, mass, nipple discharge, skin change or tenderness.  Abdominal:     General: Bowel sounds are normal.     Palpations: Abdomen is soft.     Tenderness: There is no abdominal tenderness.  Genitourinary:    Comments: Urine sample posoitive for moderato blood.  Musculoskeletal:        General: Normal range of motion.     Cervical back: Normal range of motion and neck supple.   Lymphadenopathy:     Cervical: No cervical adenopathy.     Upper Body:     Right upper body: No axillary adenopathy.     Left upper body: No axillary adenopathy.  Skin:    General: Skin is warm and dry.  Neurological:     Mental Status: She is alert and oriented to person, place, and time.     Cranial Nerves: No cranial nerve deficit.  Psychiatric:        Attention and Perception: Attention and perception normal.        Mood and Affect: Mood is anxious and depressed.        Speech: Speech normal.        Behavior: Behavior normal. Behavior is cooperative.        Thought Content: Thought content normal.  Cognition and Memory: Cognition and memory normal.        Judgment: Judgment normal.      LABS: Recent Results (from the past 2160 hour(s))  CULTURE, URINE COMPREHENSIVE     Status: Abnormal   Collection Time: 11/19/19 12:00 AM   Specimen: Urine   URINE  Result Value Ref Range   Urine Culture, Comprehensive Final report (A)    Organism ID, Bacteria Enterococcus faecalis (A)     Comment: 10,000-25,000 colony forming units per mL   Organism ID, Bacteria Comment     Comment: Mixed urogenital flora 900 Colonies/mL    ANTIMICROBIAL SUSCEPTIBILITY Comment     Comment:       ** S = Susceptible; I = Intermediate; R = Resistant **                    P = Positive; N = Negative             MICS are expressed in micrograms per mL    Antibiotic                 RSLT#1    RSLT#2    RSLT#3    RSLT#4 Ciprofloxacin                  S Levofloxacin                   S Nitrofurantoin                 S Penicillin                     S Tetracycline                   S Vancomycin                     S   POCT Urinalysis Dipstick     Status: Abnormal   Collection Time: 11/19/19  2:38 PM  Result Value Ref Range   Color, UA     Clarity, UA     Glucose, UA Negative Negative   Bilirubin, UA negative    Ketones, UA negative    Spec Grav, UA >=1.030 (A) 1.010 - 1.025   Blood, UA  moderate    pH, UA 5.0 5.0 - 8.0   Protein, UA Negative Negative   Urobilinogen, UA 0.2 0.2 or 1.0 E.U./dL   Nitrite, UA negative    Leukocytes, UA Negative Negative   Appearance     Odor      Assessment/Plan: 1. Encounter for general adult medical examination with abnormal findings Annual health maintenance exam today. Lab slip given to get routine, fasting labs drawn.   2. Urinary tract infection with hematuria, site unspecified Start bactrim DS bid for 7 days. Send urine for culture and sensitivity and adjust antibiotics as indicated  - sulfamethoxazole-trimethoprim (BACTRIM DS) 800-160 MG tablet; Take 1 tablet by mouth 2 (two) times daily.  Dispense: 14 tablet; Refill: 0 - CULTURE, URINE COMPREHENSIVE  3. Generalized anxiety disorder Encouraged her to take celexa 20mg  tablets twice daily to help with worsening anxiety and depression. May take alprazolam 0.5mg  up to twice daily if needed for acute anxiety - ALPRAZolam (XANAX) 0.5 MG tablet; Take 1 tablet (0.5 mg total) by mouth 2 (two) times daily as needed for anxiety.  Dispense: 60 tablet; Refill: 3 - citalopram (CELEXA) 20 MG tablet; Take 1 tablet (20  mg total) by mouth 2 (two) times daily.  Dispense: 60 tablet; Refill: 3  4. Other fatigue cjheck routine, fasting labs.   5. Nicotine dependence, cigarettes, uncomplicated Discussed smoking cessation with the patient. She is not ready to quit at this time.   6. Flu vaccine need - Flu Vaccine MDCK QUAD PF  7. Encounter for hepatitis C screening test for low risk patient Check hep c screening with routine labs.   8. Dysuria - POCT Urinalysis Dipstick  General Counseling: Christy verbalizes understanding of the findings of todays visit and agrees with plan of treatment. I have discussed any further diagnostic evaluation that may be needed or ordered today. We also reviewed her medications today. she has been encouraged to call the office with any questions or concerns that  should arise related to todays visit.    Counseling:  This patient was seen by Vincent Gros FNP Collaboration with Dr Lyndon Code as a part of collaborative care agreement  Orders Placed This Encounter  Procedures  . CULTURE, URINE COMPREHENSIVE  . Flu Vaccine MDCK QUAD PF  . POCT Urinalysis Dipstick    Meds ordered this encounter  Medications  . sulfamethoxazole-trimethoprim (BACTRIM DS) 800-160 MG tablet    Sig: Take 1 tablet by mouth 2 (two) times daily.    Dispense:  14 tablet    Refill:  0    Order Specific Question:   Supervising Provider    Answer:   Lyndon Code [[id number]]  . ALPRAZolam (XANAX) 0.5 MG tablet    Sig: Take 1 tablet (0.5 mg total) by mouth 2 (two) times daily as needed for anxiety.    Dispense:  60 tablet    Refill:  3    Order Specific Question:   Supervising Provider    Answer:   Lyndon Code [[id number]]  . citalopram (CELEXA) 20 MG tablet    Sig: Take 1 tablet (20 mg total) by mouth 2 (two) times daily.    Dispense:  60 tablet    Refill:  3    Order Specific Question:   Supervising Provider    Answer:   Lyndon Code [[id number]]    Time spent: 54 Minutes      Lyndon Code, MD  Internal Medicine

## 2019-11-19 NOTE — Telephone Encounter (Signed)
Confirmed appointment with patient. klh °

## 2019-11-20 NOTE — Progress Notes (Signed)
Patient treated with bactrim at time of visit.

## 2019-11-23 LAB — CULTURE, URINE COMPREHENSIVE

## 2019-11-27 DIAGNOSIS — Z23 Encounter for immunization: Secondary | ICD-10-CM | POA: Insufficient documentation

## 2019-12-02 ENCOUNTER — Telehealth: Payer: Self-pay

## 2019-12-02 ENCOUNTER — Other Ambulatory Visit: Payer: Self-pay | Admitting: Nurse Practitioner

## 2019-12-02 DIAGNOSIS — N390 Urinary tract infection, site not specified: Secondary | ICD-10-CM

## 2019-12-02 MED ORDER — CIPROFLOXACIN HCL 500 MG PO TABS: 500.0000 mg | ORAL_TABLET | Freq: Two times a day (BID) | ORAL | 0 refills | Status: DC

## 2019-12-02 NOTE — Progress Notes (Signed)
Still having UTI. Sent new rx for cipro 500mg twice daily for next 7 days to [facility].

## 2019-12-02 NOTE — Telephone Encounter (Signed)
Still having UTI. Sent new rx for cipro 500mg  twice daily for next 7 days to [facility].

## 2019-12-02 NOTE — Telephone Encounter (Signed)
Pt advised we send cipro

## 2020-01-24 ENCOUNTER — Other Ambulatory Visit: Payer: Self-pay | Admitting: Nurse Practitioner

## 2020-01-25 LAB — COMPREHENSIVE METABOLIC PANEL
ALT: 6 IU/L (ref 0–32)
AST: 15 IU/L (ref 0–40)
Albumin/Globulin Ratio: 2.4 — ABNORMAL HIGH (ref 1.2–2.2)
Albumin: 4.6 g/dL (ref 3.8–4.8)
Alkaline Phosphatase: 79 IU/L (ref 39–117)
BUN/Creatinine Ratio: 14 (ref 12–28)
BUN: 11 mg/dL (ref 8–27)
Bilirubin Total: 0.5 mg/dL (ref 0.0–1.2)
CO2: 28 mmol/L (ref 20–29)
Calcium: 9.6 mg/dL (ref 8.7–10.3)
Chloride: 103 mmol/L (ref 96–106)
Creatinine, Ser: 0.8 mg/dL (ref 0.57–1.00)
GFR calc Af Amer: 91 mL/min/{1.73_m2} (ref >59)
GFR calc non Af Amer: 79 mL/min/{1.73_m2} (ref >59)
Globulin, Total: 1.9 g/dL (ref 1.5–4.5)
Glucose: 101 mg/dL — ABNORMAL HIGH (ref 65–99)
Potassium: 4.2 mmol/L (ref 3.5–5.2)
Sodium: 144 mmol/L (ref 134–144)
Total Protein: 6.5 g/dL (ref 6.0–8.5)

## 2020-01-25 LAB — HCV COMMENT:

## 2020-01-25 LAB — LIPID PANEL W/O CHOL/HDL RATIO
Cholesterol, Total: 219 mg/dL — ABNORMAL HIGH (ref 100–199)
HDL: 55 mg/dL (ref >39)
LDL Chol Calc (NIH): 143 mg/dL — ABNORMAL HIGH (ref 0–99)
Triglycerides: 116 mg/dL (ref 0–149)
VLDL Cholesterol Cal: 21 mg/dL (ref 5–40)

## 2020-01-25 LAB — T4, FREE: Free T4: 1.14 ng/dL (ref 0.82–1.77)

## 2020-01-25 LAB — TSH: TSH: 0.874 u[IU]/mL (ref 0.450–4.500)

## 2020-01-25 LAB — VITAMIN D 25 HYDROXY (VIT D DEFICIENCY, FRACTURES): Vit D, 25-Hydroxy: 28.6 ng/mL — ABNORMAL LOW (ref 30.0–100.0)

## 2020-01-25 LAB — HEPATITIS C ANTIBODY (REFLEX): HCV Ab: <0.1 s/co ratio (ref 0.0–0.9)

## 2020-02-03 NOTE — Progress Notes (Signed)
Labs look ok. Discuss at visit 03/16/2020

## 2020-03-12 ENCOUNTER — Telehealth: Payer: Self-pay

## 2020-03-12 NOTE — Telephone Encounter (Signed)
Lmom to confirm and screen for 03-16-20 ov. 

## 2020-03-16 ENCOUNTER — Encounter: Payer: Self-pay | Admitting: Nurse Practitioner

## 2020-03-16 ENCOUNTER — Telehealth: Payer: Self-pay

## 2020-03-16 ENCOUNTER — Ambulatory Visit (INDEPENDENT_AMBULATORY_CARE_PROVIDER_SITE_OTHER): Payer: BLUE CROSS/BLUE SHIELD | Admitting: Nurse Practitioner

## 2020-03-16 VITALS — Temp 97.6°F | Ht 63.0 in | Wt 142.0 lb

## 2020-03-16 DIAGNOSIS — N390 Urinary tract infection, site not specified: Secondary | ICD-10-CM

## 2020-03-16 DIAGNOSIS — J069 Acute upper respiratory infection, unspecified: Secondary | ICD-10-CM | POA: Diagnosis not present

## 2020-03-16 DIAGNOSIS — R062 Wheezing: Secondary | ICD-10-CM | POA: Diagnosis not present

## 2020-03-16 DIAGNOSIS — F411 Generalized anxiety disorder: Secondary | ICD-10-CM | POA: Diagnosis not present

## 2020-03-16 MED ORDER — CITALOPRAM HYDROBROMIDE 20 MG PO TABS: 20.0000 mg | ORAL_TABLET | Freq: Two times a day (BID) | ORAL | 3 refills | Status: DC

## 2020-03-16 MED ORDER — CIPROFLOXACIN HCL 500 MG PO TABS: 500.0000 mg | ORAL_TABLET | Freq: Two times a day (BID) | ORAL | 0 refills | Status: DC

## 2020-03-16 MED ORDER — IPRATROPIUM-ALBUTEROL 0.5-2.5 (3) MG/3ML IN SOLN: 3.0000 mL | Freq: Four times a day (QID) | RESPIRATORY_TRACT | 3 refills | Status: DC | PRN

## 2020-03-16 MED ORDER — ALPRAZOLAM 0.5 MG PO TABS: 0.5000 mg | ORAL_TABLET | Freq: Two times a day (BID) | ORAL | 3 refills | Status: DC | PRN

## 2020-03-16 MED ORDER — ALBUTEROL SULFATE HFA 108 (90 BASE) MCG/ACT IN AERS: 2.0000 | INHALATION_SPRAY | Freq: Four times a day (QID) | RESPIRATORY_TRACT | 3 refills | Status: DC | PRN

## 2020-03-16 NOTE — Telephone Encounter (Signed)
Confirmed appointment on 03/16/2020. klh 

## 2020-03-16 NOTE — Progress Notes (Signed)
[facility] [address] [place], [place] [address]  Internal MEDICINE  Telephone Visit  Patient Name: Felicia Blackwell  [id number]  [id number]  Date of Service: 03/31/2020  I connected with the patient at 3:26pm by telephone and verified the patients identity using two identifiers.   I discussed the limitations, risks, security and privacy concerns of performing an evaluation and management service by telephone and the availability of in person appointments. I also discussed with the patient that there may be a patient responsible charge related to the service.  The patient expressed understanding and agrees to proceed.    Chief Complaint  Patient presents with  . Telephone Screen  . Telephone Assessment  . Cough  . Sinusitis  . Depression  . Anxiety    The patient has been contacted via telephone for follow up visit due to concerns for spread of novel coronavirus. Today, the patient states that last Thursday, she went outside to blow leaves. She, then, developed nasal congestion, which has turned into chest congestion, cough, and headache. Her cough is congested but not productive.  She denies nausea or vomiting. She denies fever or chills.  She denies exposure to COVID 19 or other contagious illnesses. She was to present in the office today for routine follow up for chronic anxiety and depression. She is doing well with current medications.       Current Medication: Outpatient Encounter Medications as of 03/16/2020  Medication Sig  . ALPRAZolam (XANAX) 0.5 MG tablet Take 1 tablet (0.5 mg total) by mouth 2 (two) times daily as needed for anxiety.  . citalopram (CELEXA) 20 MG tablet Take 1 tablet (20 mg total) by mouth 2 (two) times daily.  . famotidine (PEPCID) 20 MG tablet Take 1 tablet (20 mg total) by mouth 2 (two) times daily.  . Multiple Vitamins-Minerals (MULTIVITAMIN ADULT PO) Take by mouth daily.  . ranitidine (ZANTAC) 150 MG tablet Take 150 mg by mouth daily as  needed for heartburn.  . [DISCONTINUED] ALPRAZolam (XANAX) 0.5 MG tablet Take 1 tablet (0.5 mg total) by mouth 2 (two) times daily as needed for anxiety.  . [DISCONTINUED] ciprofloxacin (CIPRO) 500 MG tablet Take 1 tablet (500 mg total) by mouth 2 (two) times daily.  . [DISCONTINUED] ciprofloxacin (CIPRO) 500 MG tablet Take 1 tablet (500 mg total) by mouth 2 (two) times daily.  . [DISCONTINUED] citalopram (CELEXA) 20 MG tablet Take 1 tablet (20 mg total) by mouth 2 (two) times daily.  Marland Kitchen albuterol (VENTOLIN HFA) 108 (90 Base) MCG/ACT inhaler Inhale 2 puffs into the lungs every 6 (six) hours as needed for wheezing or shortness of breath.  Marland Kitchen ipratropium-albuterol (DUONEB) 0.5-2.5 (3) MG/3ML SOLN Take 3 mLs by nebulization every 6 (six) hours as needed.  . [DISCONTINUED] albuterol (PROVENTIL HFA;VENTOLIN HFA) 108 (90 Base) MCG/ACT inhaler Inhale into the lungs.  . [DISCONTINUED] sulfamethoxazole-trimethoprim (BACTRIM DS) 800-160 MG tablet Take 1 tablet by mouth 2 (two) times daily. (Patient not taking: Reported on 03/16/2020)   No facility-administered encounter medications on file as of 03/16/2020.    Surgical History: Past Surgical History:  Procedure Laterality Date  . TUBAL LIGATION      Medical History: Past Medical History:  Diagnosis Date  . Anxiety   . Depression   . GERD (gastroesophageal reflux disease)     Family History: Family History  Family history unknown: Yes    Social History   Socioeconomic History  . Marital status: Married    Spouse name: Not on  file  . Number of children: Not on file  . Years of education: Not on file  . Highest education level: Not on file  Occupational History  . Not on file  Tobacco Use  . Smoking status: Current Every Day Smoker    Packs/day: 1.00    Types: Cigarettes  . Smokeless tobacco: Never Used  Substance and Sexual Activity  . Alcohol use: Never  . Drug use: Never  . Sexual activity: Not on file  Other Topics Concern  .  Not on file  Social History Narrative  . Not on file   Social Determinants of Health   Financial Resource Strain:   . Difficulty of Paying Living Expenses:   Food Insecurity:   . Worried About Charity fundraiser in the Last Year:   . Arboriculturist in the Last Year:   Transportation Needs:   . Film/video editor (Medical):   Marland Kitchen Lack of Transportation (Non-Medical):   Physical Activity:   . Days of Exercise per Week:   . Minutes of Exercise per Session:   Stress:   . Feeling of Stress :   Social Connections:   . Frequency of Communication with Friends and Family:   . Frequency of Social Gatherings with Friends and Family:   . Attends Religious Services:   . Active Member of Clubs or Organizations:   . Attends Archivist Meetings:   Marland Kitchen Marital Status:   Intimate Partner Violence:   . Fear of Current or Ex-Partner:   . Emotionally Abused:   Marland Kitchen Physically Abused:   . Sexually Abused:       Review of Systems  Constitutional: Positive for fatigue. Negative for chills.  HENT: Positive for congestion, ear pain, postnasal drip, rhinorrhea, sinus pain, sore throat and voice change.   Respiratory: Positive for cough. Negative for wheezing.   Cardiovascular: Negative for chest pain and palpitations.  Gastrointestinal: Negative for nausea and vomiting.  Endocrine: Negative for cold intolerance, heat intolerance, polydipsia and polyuria.  Genitourinary: Positive for dysuria, frequency and urgency.  Musculoskeletal: Negative for arthralgias and myalgias.  Allergic/Immunologic: Positive for environmental allergies.  Neurological: Positive for headaches. Negative for dizziness.    Today's Vitals   03/16/20 1501  Temp: 97.6 F (36.4 C)  Weight: 142 lb (64.4 kg)  Height: 5\' 3"  (1.6 m)   Body mass index is 25.15 kg/m.  Observation/Objective:   The patient is alert and oriented. She is pleasant and answers all questions appropriately. Breathing is non-labored. She  is in no acute distress at this time. She is nasally congested and her voice is more hoarse than usual.    Assessment/Plan: 1. Acute upper respiratory infection Start cipro 500mg  bid for 7 days. Rest and increase fluids. Take OTC medications as needed and as indicated for acute symptoms.   2. Wheezing New rescue inhaler prescribed. Use two inhalations up to four times daily as needed for wheezing and shortness of breath.  - albuterol (VENTOLIN HFA) 108 (90 Base) MCG/ACT inhaler; Inhale 2 puffs into the lungs every 6 (six) hours as needed for wheezing or shortness of breath.  Dispense: 18 g; Refill: 3 - ipratropium-albuterol (DUONEB) 0.5-2.5 (3) MG/3ML SOLN; Take 3 mLs by nebulization every 6 (six) hours as needed.  Dispense: 120 mL; Refill: 3  3. Urinary tract infection without hematuria, site unspecified cipro 500mg  bid for 7 days. If no improvement, will need to submit urine sample for further evaluation.   4. Generalized anxiety disorder  Stable. Continue citalopram 20mg  twice daily. May take alprazolam 0.5mg  twice daily as needed for acute anxiety. New prescriptions sent to her pharmacy.   - ALPRAZolam (XANAX) 0.5 MG tablet; Take 1 tablet (0.5 mg total) by mouth 2 (two) times daily as needed for anxiety.  Dispense: 60 tablet; Refill: 3 - citalopram (CELEXA) 20 MG tablet; Take 1 tablet (20 mg total) by mouth 2 (two) times daily.  Dispense: 60 tablet; Refill: 3  General Counseling: Felicia Blackwell verbalizes understanding of the findings of today's phone visit and agrees with plan of treatment. I have discussed any further diagnostic evaluation that may be needed or ordered today. We also reviewed her medications today. she has been encouraged to call the office with any questions or concerns that should arise related to todays visit.   This patient was seen by FNP Collaboration with Dr Vincent Gros as a part of collaborative care agreement  Meds ordered this encounter  Medications   . albuterol (VENTOLIN HFA) 108 (90 Base) MCG/ACT inhaler    Sig: Inhale 2 puffs into the lungs every 6 (six) hours as needed for wheezing or shortness of breath.    Dispense:  18 g    Refill:  3    Order Specific Question:   Supervising Provider    Answer:   Lyndon Code [[id number]]  . ipratropium-albuterol (DUONEB) 0.5-2.5 (3) MG/3ML SOLN    Sig: Take 3 mLs by nebulization every 6 (six) hours as needed.    Dispense:  120 mL    Refill:  3    Order Specific Question:   Supervising Provider    Answer:   Lyndon Code [[id number]]  . ALPRAZolam (XANAX) 0.5 MG tablet    Sig: Take 1 tablet (0.5 mg total) by mouth 2 (two) times daily as needed for anxiety.    Dispense:  60 tablet    Refill:  3    Order Specific Question:   Supervising Provider    Answer:   Lyndon Code [[id number]]  . citalopram (CELEXA) 20 MG tablet    Sig: Take 1 tablet (20 mg total) by mouth 2 (two) times daily.    Dispense:  60 tablet    Refill:  3    Order Specific Question:   Supervising Provider    Answer:   Lyndon Code [[id number]]  . DISCONTD: ciprofloxacin (CIPRO) 500 MG tablet    Sig: Take 1 tablet (500 mg total) by mouth 2 (two) times daily.    Dispense:  14 tablet    Refill:  0    Order Specific Question:   Supervising Provider    Answer:   Lyndon Code [[id number]]    Time spent: 43 Minutes    Dr 26 Internal medicine

## 2020-03-24 ENCOUNTER — Encounter: Payer: Self-pay | Admitting: Adult Health

## 2020-03-24 ENCOUNTER — Ambulatory Visit (INDEPENDENT_AMBULATORY_CARE_PROVIDER_SITE_OTHER): Payer: BLUE CROSS/BLUE SHIELD | Admitting: Adult Health

## 2020-03-24 VITALS — BP 172/84 | HR 67 | Temp 97.5°F | Resp 16 | Ht 63.0 in | Wt 142.0 lb

## 2020-03-24 DIAGNOSIS — J0111 Acute recurrent frontal sinusitis: Secondary | ICD-10-CM | POA: Diagnosis not present

## 2020-03-24 DIAGNOSIS — B373 Candidiasis of vulva and vagina: Secondary | ICD-10-CM

## 2020-03-24 DIAGNOSIS — F17208 Nicotine dependence, unspecified, with other nicotine-induced disorders: Secondary | ICD-10-CM

## 2020-03-24 DIAGNOSIS — B3731 Acute candidiasis of vulva and vagina: Secondary | ICD-10-CM

## 2020-03-24 MED ORDER — FLUCONAZOLE 150 MG PO TABS: 150.0000 mg | ORAL_TABLET | ORAL | 1 refills | Status: DC | PRN

## 2020-03-24 MED ORDER — AMOXICILLIN-POT CLAVULANATE 875-125 MG PO TABS: 1.0000 | ORAL_TABLET | Freq: Two times a day (BID) | ORAL | 0 refills | Status: DC

## 2020-03-24 NOTE — Progress Notes (Signed)
[facility] [address] [place], [place] [address]  Internal MEDICINE  Office Visit Note  Patient Name: Felicia Blackwell  [id number]  [id number]  Date of Service: 03/24/2020  Chief Complaint  Patient presents with  . Telephone Screen  . Telephone Assessment  . Acute Visit    sinus drainage on left side of throat  ear ache on left ear, just finished antibiotic   . Cough     HPI Pt is here for a sick visit.  She reports a few weeks of sinus drainage and pressure.  She is having congestion and fullness in her face and left ear.  She denies fever.  She has been minimally coughing, and is coughing up mucous is clear.       Current Medication:  Outpatient Encounter Medications as of 03/24/2020  Medication Sig  . albuterol (VENTOLIN HFA) 108 (90 Base) MCG/ACT inhaler Inhale 2 puffs into the lungs every 6 (six) hours as needed for wheezing or shortness of breath.  . ALPRAZolam (XANAX) 0.5 MG tablet Take 1 tablet (0.5 mg total) by mouth 2 (two) times daily as needed for anxiety.  . citalopram (CELEXA) 20 MG tablet Take 1 tablet (20 mg total) by mouth 2 (two) times daily.  . famotidine (PEPCID) 20 MG tablet Take 1 tablet (20 mg total) by mouth 2 (two) times daily.  Marland Kitchen ipratropium-albuterol (DUONEB) 0.5-2.5 (3) MG/3ML SOLN Take 3 mLs by nebulization every 6 (six) hours as needed.  . Multiple Vitamins-Minerals (MULTIVITAMIN ADULT PO) Take by mouth daily.  . ranitidine (ZANTAC) 150 MG tablet Take 150 mg by mouth daily as needed for heartburn.  . [DISCONTINUED] ciprofloxacin (CIPRO) 500 MG tablet Take 1 tablet (500 mg total) by mouth 2 (two) times daily.  Marland Kitchen amoxicillin-clavulanate (AUGMENTIN) 875-125 MG tablet Take 1 tablet by mouth 2 (two) times daily.  . fluconazole (DIFLUCAN) 150 MG tablet Take 1 tablet (150 mg total) by mouth every three (3) days as needed.   No facility-administered encounter medications on file as of 03/24/2020.      Medical History: Past Medical  History:  Diagnosis Date  . Anxiety   . Depression   . GERD (gastroesophageal reflux disease)      Vital Signs: BP (!) 172/84   Pulse 67   Temp (!) 97.5 F (36.4 C)   Resp 16   Ht 5\' 3"  (1.6 m)   Wt 142 lb (64.4 kg)   SpO2 96%   BMI 25.15 kg/m    Review of Systems  Constitutional: Negative for chills, fatigue and unexpected weight change.  HENT: Negative for congestion, rhinorrhea, sneezing and sore throat.   Eyes: Negative for photophobia, pain and redness.  Respiratory: Negative for cough, chest tightness and shortness of breath.   Cardiovascular: Negative for chest pain and palpitations.  Gastrointestinal: Negative for abdominal pain, constipation, diarrhea, nausea and vomiting.  Endocrine: Negative.   Genitourinary: Negative for dysuria and frequency.  Musculoskeletal: Negative for arthralgias, back pain, joint swelling and neck pain.  Skin: Negative for rash.  Allergic/Immunologic: Negative.   Neurological: Negative for tremors and numbness.  Hematological: Negative for adenopathy. Does not bruise/bleed easily.  Psychiatric/Behavioral: Negative for behavioral problems and sleep disturbance. The patient is not nervous/anxious.     Physical Exam Vitals and nursing note reviewed.  Constitutional:      General: She is not in acute distress.    Appearance: She is well-developed. She is not diaphoretic.  HENT:     Head: Normocephalic and atraumatic.  Mouth/Throat:     Pharynx: No oropharyngeal exudate.  Eyes:     Pupils: Pupils are equal, round, and reactive to light.  Neck:     Thyroid: No thyromegaly.     Vascular: No JVD.     Trachea: No tracheal deviation.  Cardiovascular:     Rate and Rhythm: Normal rate and regular rhythm.     Heart sounds: Normal heart sounds. No murmur. No friction rub. No gallop.   Pulmonary:     Effort: Pulmonary effort is normal. No respiratory distress.     Breath sounds: Normal breath sounds. No wheezing or rales.  Chest:      Chest wall: No tenderness.  Abdominal:     Palpations: Abdomen is soft.     Tenderness: There is no abdominal tenderness. There is no guarding.  Musculoskeletal:        General: Normal range of motion.     Cervical back: Normal range of motion and neck supple.  Lymphadenopathy:     Cervical: No cervical adenopathy.  Skin:    General: Skin is warm and dry.  Neurological:     Mental Status: She is alert and oriented to person, place, and time.     Cranial Nerves: No cranial nerve deficit.  Psychiatric:        Behavior: Behavior normal.        Thought Content: Thought content normal.        Judgment: Judgment normal.    Assessment/Plan: 1. Acute recurrent frontal sinusitis Advised patient to take entire course of antibiotics as prescribed with food. Pt should return to clinic in 7-10 days if symptoms fail to improve or new symptoms develop.  - amoxicillin-clavulanate (AUGMENTIN) 875-125 MG tablet; Take 1 tablet by mouth 2 (two) times daily.  Dispense: 14 tablet; Refill: 0  2. Vaginal candida Take diflucan as prescribed - fluconazole (DIFLUCAN) 150 MG tablet; Take 1 tablet (150 mg total) by mouth every three (3) days as needed.  Dispense: 3 tablet; Refill: 1  3. Nicotine dependence with other nicotine-induced disorder, unspecified nicotine product type Smoking cessation counseling: 1. Pt acknowledges the risks of long term smoking, she will try to quite smoking. 2. Options for different medications including nicotine products, chewing gum, patch etc, Wellbutrin and Chantix is discussed 3. Goal and date of compete cessation is discussed 4. Total time spent in smoking cessation is 15 min.   General Counseling: matayah reyburn understanding of the findings of todays visit and agrees with plan of treatment. I have discussed any further diagnostic evaluation that may be needed or ordered today. We also reviewed her medications today. she has been encouraged to call the office with any  questions or concerns that should arise related to todays visit.   No orders of the defined types were placed in this encounter.   Meds ordered this encounter  Medications  . amoxicillin-clavulanate (AUGMENTIN) 875-125 MG tablet    Sig: Take 1 tablet by mouth 2 (two) times daily.    Dispense:  14 tablet    Refill:  0  . fluconazole (DIFLUCAN) 150 MG tablet    Sig: Take 1 tablet (150 mg total) by mouth every three (3) days as needed.    Dispense:  3 tablet    Refill:  1    Time spent: 30 Minutes  This patient was seen by Orson Gear AGNP-C in Collaboration with Dr Lavera Guise as a part of collaborative care agreement.  Kendell Bane AGNP-C Internal Medicine

## 2020-03-31 DIAGNOSIS — J069 Acute upper respiratory infection, unspecified: Secondary | ICD-10-CM | POA: Insufficient documentation

## 2020-03-31 DIAGNOSIS — R062 Wheezing: Secondary | ICD-10-CM | POA: Insufficient documentation

## 2020-04-14 ENCOUNTER — Ambulatory Visit: Payer: Self-pay | Attending: Internal Medicine

## 2020-04-14 DIAGNOSIS — Z23 Encounter for immunization: Secondary | ICD-10-CM

## 2020-04-14 NOTE — Progress Notes (Signed)
   Covid-19 Vaccination Clinic  Name:  Felicia Blackwell    MRN: [id number] DOB: [birth year]  04/14/2020  Felicia Blackwell was observed post Covid-19 immunization for 15 minutes without incident. She was provided with Vaccine Information Sheet and instruction to access the V-Safe system.   Felicia Blackwell was instructed to call 911 with any severe reactions post vaccine: Marland Kitchen Difficulty breathing  . Swelling of face and throat  . A fast heartbeat  . A bad rash all over body  . Dizziness and weakness   Immunizations Administered    Name Date Dose VIS Date Route   Pfizer COVID-19 Vaccine 04/14/2020 10:19 AM 0.3 mL 01/15/2019 Intramuscular   Manufacturer: [employer], [employer]   Lot: [id number]   NDC: [address]-2

## 2020-05-05 ENCOUNTER — Ambulatory Visit: Payer: Self-pay

## 2020-05-08 ENCOUNTER — Ambulatory Visit: Payer: Self-pay | Attending: Internal Medicine

## 2020-05-08 DIAGNOSIS — Z23 Encounter for immunization: Secondary | ICD-10-CM

## 2020-05-08 NOTE — Progress Notes (Signed)
   Covid-19 Vaccination Clinic  Name:  Felicia Blackwell    MRN: [id number] DOB: April 25, 1956  05/08/2020  [name] was observed post Covid-19 immunization for 15 minutes without incident. She was provided with Vaccine Information Sheet and instruction to access the V-Safe system.   [name] was instructed to call 911 with any severe reactions post vaccine: Marland Kitchen Difficulty breathing  . Swelling of face and throat  . A fast heartbeat  . A bad rash all over body  . Dizziness and weakness   Immunizations Administered    Name Date Dose VIS Date Route   Pfizer COVID-19 Vaccine 05/08/2020  9:01 AM 0.3 mL 01/15/2019 Intramuscular   Manufacturer: [employer], [employer]   Lot: [id number]   NDC: [address]-4

## 2020-05-12 ENCOUNTER — Ambulatory Visit: Payer: Self-pay

## 2020-07-16 ENCOUNTER — Ambulatory Visit: Payer: BLUE CROSS/BLUE SHIELD | Admitting: Nurse Practitioner

## 2020-07-23 ENCOUNTER — Telehealth: Payer: Self-pay

## 2020-07-23 NOTE — Telephone Encounter (Signed)
Lmom to confirm and screen for 07-28-20 ov. 

## 2020-07-28 ENCOUNTER — Other Ambulatory Visit: Payer: Self-pay

## 2020-07-28 ENCOUNTER — Other Ambulatory Visit: Payer: Self-pay | Admitting: Nurse Practitioner

## 2020-07-28 ENCOUNTER — Encounter: Payer: Self-pay | Admitting: Nurse Practitioner

## 2020-07-28 ENCOUNTER — Ambulatory Visit (INDEPENDENT_AMBULATORY_CARE_PROVIDER_SITE_OTHER): Payer: BLUE CROSS/BLUE SHIELD | Admitting: Nurse Practitioner

## 2020-07-28 VITALS — BP 179/87 | HR 84 | Temp 97.4°F | Resp 16 | Ht 63.0 in | Wt 145.6 lb

## 2020-07-28 DIAGNOSIS — Z206 Contact with and (suspected) exposure to human immunodeficiency virus [HIV]: Secondary | ICD-10-CM

## 2020-07-28 DIAGNOSIS — Z205 Contact with and (suspected) exposure to viral hepatitis: Secondary | ICD-10-CM | POA: Diagnosis not present

## 2020-07-28 DIAGNOSIS — F411 Generalized anxiety disorder: Secondary | ICD-10-CM

## 2020-07-28 DIAGNOSIS — R030 Elevated blood-pressure reading, without diagnosis of hypertension: Secondary | ICD-10-CM

## 2020-07-28 DIAGNOSIS — K644 Residual hemorrhoidal skin tags: Secondary | ICD-10-CM

## 2020-07-28 MED ORDER — CITALOPRAM HYDROBROMIDE 20 MG PO TABS: 20.0000 mg | ORAL_TABLET | Freq: Two times a day (BID) | ORAL | 3 refills | Status: DC

## 2020-07-28 MED ORDER — ALPRAZOLAM 0.5 MG PO TABS: 0.5000 mg | ORAL_TABLET | Freq: Two times a day (BID) | ORAL | 3 refills | Status: DC | PRN

## 2020-07-28 MED ORDER — LIDOCAINE-HYDROCORTISONE ACE 3-0.5 % EX CREA: TOPICAL_CREAM | CUTANEOUS | 3 refills | Status: DC

## 2020-07-28 NOTE — Progress Notes (Signed)
[facility] [address] [place], [place] [address]  Internal MEDICINE  Office Visit Note  Patient Name: Felicia Blackwell  [id number]  [id number]  Date of Service: 08/12/2020  Chief Complaint  Patient presents with  . Follow-up    hymrhoids pain, concerned on having hep C  . Depression  . Gastroesophageal Reflux    The patient is here for routine follow up. She states that she is having a flare up of hemorrhoids. She states that hemorrhoids are bleeding. Feels like cauliflower around her rectum. She was taking care of a friend who passed away on 2023/04/02. He died from hepatitis C and cirrhosis of the liver. She is afraid of exposure to hepatitis. Also afraid of exposure to HIV as patient's friend who was with them, has HIV.  She states that she has been having severe anxiety.  The patient is currently taking citalopram 20mg  once daily. Prescription is for twice daily and she has never increased the dose. She does take alprazolam as needed. Most of the time, she takes this only at night.       Current Medication: Outpatient Encounter Medications as of 07/28/2020  Medication Sig  . albuterol (VENTOLIN HFA) 108 (90 Base) MCG/ACT inhaler Inhale 2 puffs into the lungs every 6 (six) hours as needed for wheezing or shortness of breath.  . ALPRAZolam (XANAX) 0.5 MG tablet Take 1 tablet (0.5 mg total) by mouth 2 (two) times daily as needed for anxiety.  . citalopram (CELEXA) 20 MG tablet Take 1 tablet (20 mg total) by mouth 2 (two) times daily.  . famotidine (PEPCID) 20 MG tablet Take 1 tablet (20 mg total) by mouth 2 (two) times daily.  09/27/2020 ipratropium-albuterol (DUONEB) 0.5-2.5 (3) MG/3ML SOLN Take 3 mLs by nebulization every 6 (six) hours as needed.  . Multiple Vitamins-Minerals (MULTIVITAMIN ADULT PO) Take by mouth daily.  . ranitidine (ZANTAC) 150 MG tablet Take 150 mg by mouth daily as needed for heartburn.  . [DISCONTINUED] ALPRAZolam (XANAX) 0.5 MG tablet Take 1 tablet (0.5 mg  total) by mouth 2 (two) times daily as needed for anxiety.  . [DISCONTINUED] citalopram (CELEXA) 20 MG tablet Take 1 tablet (20 mg total) by mouth 2 (two) times daily.  . Lidocaine-Hydrocortisone Ace 3-0.5 % CREA Apply to effected area 2 to 3 times per day prn  . [DISCONTINUED] amoxicillin-clavulanate (AUGMENTIN) 875-125 MG tablet Take 1 tablet by mouth 2 (two) times daily.  . [DISCONTINUED] fluconazole (DIFLUCAN) 150 MG tablet Take 1 tablet (150 mg total) by mouth every three (3) days as needed.   No facility-administered encounter medications on file as of 07/28/2020.    Surgical History: Past Surgical History:  Procedure Laterality Date  . TUBAL LIGATION      Medical History: Past Medical History:  Diagnosis Date  . Anxiety   . Depression   . GERD (gastroesophageal reflux disease)     Family History: Family History  Family history unknown: Yes    Social History   Socioeconomic History  . Marital status: Married    Spouse name: Not on file  . Number of children: Not on file  . Years of education: Not on file  . Highest education level: Not on file  Occupational History  . Not on file  Tobacco Use  . Smoking status: Current Every Day Smoker    Packs/day: 1.00    Types: Cigarettes  . Smokeless tobacco: Never Used  Vaping Use  . Vaping Use: Never used  Substance and  Sexual Activity  . Alcohol use: Not Currently  . Drug use: Not Currently  . Sexual activity: Not on file  Other Topics Concern  . Not on file  Social History Narrative  . Not on file   Social Determinants of Health   Financial Resource Strain:   . Difficulty of Paying Living Expenses: Not on file  Food Insecurity:   . Worried About Programme researcher, broadcasting/film/video in the Last Year: Not on file  . Ran Out of Food in the Last Year: Not on file  Transportation Needs:   . Lack of Transportation (Medical): Not on file  . Lack of Transportation (Non-Medical): Not on file  Physical Activity:   . Days of Exercise  per Week: Not on file  . Minutes of Exercise per Session: Not on file  Stress:   . Feeling of Stress : Not on file  Social Connections:   . Frequency of Communication with Friends and Family: Not on file  . Frequency of Social Gatherings with Friends and Family: Not on file  . Attends Religious Services: Not on file  . Active Member of Clubs or Organizations: Not on file  . Attends Banker Meetings: Not on file  . Marital Status: Not on file  Intimate Partner Violence:   . Fear of Current or Ex-Partner: Not on file  . Emotionally Abused: Not on file  . Physically Abused: Not on file  . Sexually Abused: Not on file      Review of Systems  Constitutional: Positive for fatigue. Negative for activity change, chills and unexpected weight change.  HENT: Negative for congestion, postnasal drip, rhinorrhea, sneezing and sore throat.   Respiratory: Negative for cough, chest tightness, shortness of breath and wheezing.   Cardiovascular: Negative for chest pain and palpitations.       Blood pressure elevated today. Likely due to increased stress and tender hemorrhoids.   Gastrointestinal: Negative for abdominal pain, constipation, diarrhea, nausea and vomiting.  Endocrine: Negative for cold intolerance, heat intolerance, polydipsia and polyuria.  Musculoskeletal: Negative for arthralgias, back pain, joint swelling and neck pain.  Skin: Negative for rash.  Allergic/Immunologic: Negative for environmental allergies.  Neurological: Negative for dizziness, tremors, numbness and headaches.  Hematological: Negative for adenopathy. Does not bruise/bleed easily.  Psychiatric/Behavioral: Positive for dysphoric mood. Negative for behavioral problems (Depression), sleep disturbance and suicidal ideas. The patient is nervous/anxious.    Today's Vitals   07/28/20 1109  BP: (!) 179/87  Pulse: 84  Resp: 16  Temp: (!) 97.4 F (36.3 C)  SpO2: 95%  Weight: 145 lb 9.6 oz (66 kg)  Height:  5\' 3"  (1.6 m)   Body mass index is 25.79 kg/m.  Physical Exam Vitals and nursing note reviewed.  Constitutional:      General: She is not in acute distress.    Appearance: Normal appearance. She is well-developed. She is not diaphoretic.  HENT:     Head: Normocephalic and atraumatic.     Nose: Nose normal.     Mouth/Throat:     Pharynx: No oropharyngeal exudate.  Eyes:     Pupils: Pupils are equal, round, and reactive to light.  Neck:     Thyroid: No thyromegaly.     Vascular: No carotid bruit or JVD.     Trachea: No tracheal deviation.  Cardiovascular:     Rate and Rhythm: Normal rate and regular rhythm.     Heart sounds: Normal heart sounds. No murmur heard.  No friction rub.  No gallop.   Pulmonary:     Effort: Pulmonary effort is normal. No respiratory distress.     Breath sounds: Normal breath sounds. No wheezing or rales.  Chest:     Chest wall: No tenderness.  Abdominal:     General: Bowel sounds are normal.     Palpations: Abdomen is soft.  Musculoskeletal:        General: Normal range of motion.     Cervical back: Normal range of motion and neck supple.  Lymphadenopathy:     Cervical: No cervical adenopathy.  Skin:    General: Skin is warm and dry.  Neurological:     Mental Status: She is alert and oriented to person, place, and time.     Cranial Nerves: No cranial nerve deficit.  Psychiatric:        Attention and Perception: Attention and perception normal.        Mood and Affect: Mood is anxious and depressed.        Speech: Speech normal.        Behavior: Behavior normal. Behavior is cooperative.        Thought Content: Thought content normal.        Cognition and Memory: Cognition and memory normal.        Judgment: Judgment normal.    Assessment/Plan: 1. External hemorrhoids Trial lidocain/hydrocortisone cream - apply to rectum tow to three times per day as needed. Advised she take stool softeners every day in order to soften stool and make bowel  movements easier.  - Lidocaine-Hydrocortisone Ace 3-0.5 % CREA; Apply to effected area 2 to 3 times per day prn  Dispense: 30 g; Refill: 3  2. Elevated blood-pressure reading without diagnosis of hypertension Will monitor closely.   3. Generalized anxiety disorder Continue citalopram 20mg  twice daily. May take alprazolam 0.5mg  up to twice daily as needed for acute anxiety. Refills for both were sent to her pharmacy today . - ALPRAZolam (XANAX) 0.5 MG tablet; Take 1 tablet (0.5 mg total) by mouth 2 (two) times daily as needed for anxiety.  Dispense: 60 tablet; Refill: 3 - citalopram (CELEXA) 20 MG tablet; Take 1 tablet (20 mg total) by mouth 2 (two) times daily.  Dispense: 60 tablet; Refill: 3  4. Exposure to hepatitis C Lab slip given to check for hepatitis C and HIV.   5. Exposure to HIV Lab slip given to check for hepatitis C and HIV.   General Counseling: kasey hansell understanding of the findings of todays visit and agrees with plan of treatment. I have discussed any further diagnostic evaluation that may be needed or ordered today. We also reviewed her medications today. she has been encouraged to call the office with any questions or concerns that should arise related to todays visit.   This patient was seen by Meyer Russel FNP Collaboration with Dr Vincent Gros as a part of collaborative care agreement  Meds ordered this encounter  Medications  . ALPRAZolam (XANAX) 0.5 MG tablet    Sig: Take 1 tablet (0.5 mg total) by mouth 2 (two) times daily as needed for anxiety.    Dispense:  60 tablet    Refill:  3    Order Specific Question:   Supervising Provider    Answer:   Lyndon Code [[id number]]  . citalopram (CELEXA) 20 MG tablet    Sig: Take 1 tablet (20 mg total) by mouth 2 (two) times daily.    Dispense:  60 tablet  Refill:  3    Order Specific Question:   Supervising Provider    Answer:   Lyndon CodeKHAN, FOZIA M [[id number]]  . Lidocaine-Hydrocortisone Ace 3-0.5 % CREA    Sig: Apply  to effected area 2 to 3 times per day prn    Dispense:  30 g    Refill:  3    Please fill with generic alternative preferred by her insurance.    Order Specific Question:   Supervising Provider    Answer:   Lyndon CodeKHAN, FOZIA M [[id number]]    Total time spent: 30 Minutes   Time spent includes review of chart, medications, test results, and follow up plan with the patient.      Dr Lyndon CodeFozia M Khan Internal medicine

## 2020-07-29 LAB — CBC
Hematocrit: 41.5 % (ref 34.0–46.6)
Hemoglobin: 14.1 g/dL (ref 11.1–15.9)
MCH: 31.8 pg (ref 26.6–33.0)
MCHC: 34 g/dL (ref 31.5–35.7)
MCV: 94 fL (ref 79–97)
Platelets: 234 10*3/uL (ref 150–450)
RBC: 4.43 x10E6/uL (ref 3.77–5.28)
RDW: 13 % (ref 11.7–15.4)
WBC: 7.3 10*3/uL (ref 3.4–10.8)

## 2020-07-29 LAB — HEPATITIS C ANTIBODY: Hep C Virus Ab: <0.1 s/co ratio (ref 0.0–0.9)

## 2020-07-29 LAB — HIV ANTIBODY (ROUTINE TESTING W REFLEX): HIV Screen 4th Generation wRfx: NONREACTIVE

## 2020-08-12 DIAGNOSIS — Z205 Contact with and (suspected) exposure to viral hepatitis: Secondary | ICD-10-CM | POA: Insufficient documentation

## 2020-08-12 DIAGNOSIS — K644 Residual hemorrhoidal skin tags: Secondary | ICD-10-CM | POA: Insufficient documentation

## 2020-08-12 DIAGNOSIS — Z206 Contact with and (suspected) exposure to human immunodeficiency virus [HIV]: Secondary | ICD-10-CM | POA: Insufficient documentation

## 2020-10-21 ENCOUNTER — Other Ambulatory Visit: Payer: Self-pay

## 2020-10-21 DIAGNOSIS — K219 Gastro-esophageal reflux disease without esophagitis: Secondary | ICD-10-CM

## 2020-10-21 MED ORDER — FAMOTIDINE 20 MG PO TABS: 20.0000 mg | ORAL_TABLET | Freq: Two times a day (BID) | ORAL | 3 refills | Status: DC

## 2020-10-27 ENCOUNTER — Other Ambulatory Visit: Payer: Self-pay

## 2020-10-27 ENCOUNTER — Ambulatory Visit (INDEPENDENT_AMBULATORY_CARE_PROVIDER_SITE_OTHER): Payer: BLUE CROSS/BLUE SHIELD | Admitting: Nurse Practitioner

## 2020-10-27 ENCOUNTER — Encounter: Payer: Self-pay | Admitting: Nurse Practitioner

## 2020-10-27 VITALS — BP 132/70 | HR 76 | Temp 97.6°F | Resp 16 | Ht 63.0 in | Wt 143.2 lb

## 2020-10-27 DIAGNOSIS — F17210 Nicotine dependence, cigarettes, uncomplicated: Secondary | ICD-10-CM

## 2020-10-27 DIAGNOSIS — Z124 Encounter for screening for malignant neoplasm of cervix: Secondary | ICD-10-CM

## 2020-10-27 DIAGNOSIS — F411 Generalized anxiety disorder: Secondary | ICD-10-CM

## 2020-10-27 DIAGNOSIS — Z0001 Encounter for general adult medical examination with abnormal findings: Secondary | ICD-10-CM | POA: Diagnosis not present

## 2020-10-27 DIAGNOSIS — R300 Dysuria: Secondary | ICD-10-CM

## 2020-10-27 NOTE — Progress Notes (Signed)
[facility] [address] [place], [place] [address]  Internal MEDICINE  Office Visit Note  Patient Name: Felicia Blackwell  [id number]  [id number]  Date of Service: 11/28/2020   Pt is here for routine health maintenance examination   Chief Complaint  Patient presents with  . Annual Exam  . Depression  . Gastroesophageal Reflux  . controlled substance form    reviewed with PT     The patient is here for health maintenance exam and pap smear.  -feeling well Depression and anxiety well managed.  -screening mammogram in 09/2020 and was negative -needs medication refills .  Current Medication: Outpatient Encounter Medications as of 10/27/2020  Medication Sig  . albuterol (VENTOLIN HFA) 108 (90 Base) MCG/ACT inhaler Inhale 2 puffs into the lungs every 6 (six) hours as needed for wheezing or shortness of breath.  . ALPRAZolam (XANAX) 0.5 MG tablet Take 1 tablet (0.5 mg total) by mouth 2 (two) times daily as needed for anxiety.  . citalopram (CELEXA) 20 MG tablet Take 1 tablet (20 mg total) by mouth 2 (two) times daily.  . famotidine (PEPCID) 20 MG tablet Take 1 tablet (20 mg total) by mouth 2 (two) times daily.  Marland Kitchen ipratropium-albuterol (DUONEB) 0.5-2.5 (3) MG/3ML SOLN Take 3 mLs by nebulization every 6 (six) hours as needed.  . Lidocaine-Hydrocortisone Ace 3-0.5 % CREA Apply to effected area 2 to 3 times per day prn  . Multiple Vitamins-Minerals (MULTIVITAMIN ADULT PO) Take by mouth daily.  . ranitidine (ZANTAC) 150 MG tablet Take 150 mg by mouth daily as needed for heartburn.   No facility-administered encounter medications on file as of 10/27/2020.    Surgical History: Past Surgical History:  Procedure Laterality Date  . TUBAL LIGATION      Medical History: Past Medical History:  Diagnosis Date  . Anxiety   . Depression   . GERD (gastroesophageal reflux disease)     Family History: Family History  Family history unknown: Yes      Review of Systems   Constitutional: Positive for fatigue. Negative for activity change, chills and unexpected weight change.  HENT: Negative for congestion, postnasal drip, rhinorrhea, sneezing and sore throat.   Respiratory: Negative for cough, chest tightness, shortness of breath and wheezing.   Cardiovascular: Negative for chest pain and palpitations.  Gastrointestinal: Negative for abdominal pain, constipation, diarrhea, nausea and vomiting.  Endocrine: Negative for cold intolerance, heat intolerance, polydipsia and polyuria.  Genitourinary: Negative for dysuria, frequency and urgency.  Musculoskeletal: Negative for arthralgias, back pain, joint swelling and neck pain.  Skin: Negative for rash.  Allergic/Immunologic: Negative for environmental allergies.  Neurological: Negative for dizziness, tremors, numbness and headaches.  Hematological: Negative for adenopathy. Does not bruise/bleed easily.  Psychiatric/Behavioral: Positive for dysphoric mood. Negative for behavioral problems (Depression), sleep disturbance and suicidal ideas. The patient is nervous/anxious.      Today's Vitals   10/27/20 1130  BP: 132/70  Pulse: 76  Resp: 16  Temp: 97.6 F (36.4 C)  SpO2: 96%  Weight: 143 lb 3.2 oz (65 kg)  Height: 5\' 3"  (1.6 m)   Body mass index is 25.37 kg/m.  Physical Exam Vitals and nursing note reviewed.  Constitutional:      General: She is not in acute distress.    Appearance: Normal appearance. She is well-developed and well-nourished. She is not diaphoretic.  HENT:     Head: Normocephalic and atraumatic.     Nose: Nose normal.     Mouth/Throat:  Mouth: Oropharynx is clear and moist.     Pharynx: No oropharyngeal exudate.  Eyes:     Extraocular Movements: EOM normal.     Pupils: Pupils are equal, round, and reactive to light.  Neck:     Thyroid: No thyromegaly.     Vascular: No carotid bruit or JVD.     Trachea: No tracheal deviation.  Cardiovascular:     Rate and Rhythm: Normal  rate and regular rhythm.     Pulses: Normal pulses.     Heart sounds: Normal heart sounds. No murmur heard. No friction rub. No gallop.   Pulmonary:     Effort: Pulmonary effort is normal. No respiratory distress.     Breath sounds: Normal breath sounds. No wheezing or rales.  Chest:     Chest wall: No tenderness.  Breasts:     Right: Normal. No swelling, bleeding, inverted nipple, mass, nipple discharge, skin change, tenderness or axillary adenopathy.     Left: Normal. No swelling, bleeding, inverted nipple, mass, nipple discharge, skin change, tenderness or axillary adenopathy.    Abdominal:     General: Bowel sounds are normal.     Palpations: Abdomen is soft.     Tenderness: There is no abdominal tenderness.     Hernia: There is no hernia in the left inguinal area or right inguinal area.  Genitourinary:    General: Normal vulva.     Labia:        Right: No rash, tenderness, lesion or injury.        Left: No rash, tenderness, lesion or injury.      Vagina: No signs of injury. No vaginal discharge, erythema, tenderness or bleeding.     Cervix: No cervical motion tenderness, discharge, friability, lesion or erythema.     Uterus: Normal.      Adnexa: Right adnexa normal and left adnexa normal.     Comments: No tenderness, masses, or organomeglay present during bimanual exam . Musculoskeletal:        General: Normal range of motion.     Cervical back: Normal range of motion and neck supple.  Lymphadenopathy:     Cervical: No cervical adenopathy.     Upper Body:     Right upper body: No axillary adenopathy.     Left upper body: No axillary adenopathy.     Lower Body: No right inguinal adenopathy. No left inguinal adenopathy.  Skin:    General: Skin is warm and dry.  Neurological:     General: No focal deficit present.     Mental Status: She is alert and oriented to person, place, and time.     Cranial Nerves: No cranial nerve deficit.  Psychiatric:        Mood and Affect:  Mood and affect and mood normal.        Behavior: Behavior normal.        Thought Content: Thought content normal.        Judgment: Judgment normal.     LABS: Recent Results (from the past 2160 hour(s))  IGP, Aptima HPV     Status: None   Collection Time: 10/27/20 11:30 AM  Result Value Ref Range   Interpretation UNS     Comment: UNSATISFACTORY FOR EVALUATION.   Category UNS     Comment: Unsatisfactory   Adequacy CTTB     Comment: Specimen processed and examined, but unsatisfactory for evaluation of epithelial abnormality because of excessively thick areas.    Clinician Provided ICD10  Comment     Comment: Z12.4   Performed by: Comment     Comment: Guillermina City, Cytotechnologist (ASCP)   QC reviewed by: Comment     Comment: Odis Luster, Cytotechnologist (ASCP)   Note: Comment     Comment: The Pap smear is a screening test designed to aid in the detection of premalignant and malignant conditions of the uterine cervix.  It is not a diagnostic procedure and should not be used as the sole means of detecting cervical cancer.  Both false-positive and false-negative reports do occur.    Test Methodology CANCELED     Comment: The Thin Prep(R) Imager was unable to read this specimen.  Therefore a manual review was performed.  Result canceled by the ancillary.    HPV Aptima Negative Negative    Comment: This nucleic acid amplification test detects fourteen high-risk HPV types (16,18,31,33,35,39,45,51,52,56,58,59,66,68) without differentiation.   UA/M w/rflx Culture, Routine     Status: None   Collection Time: 10/27/20 11:35 AM   Specimen: Urine   Urine  Result Value Ref Range   Specific Gravity, UA 1.006 1.005 - 1.030   pH, UA 6.0 5.0 - 7.5   Color, UA Yellow Yellow   Appearance Ur Clear Clear   Leukocytes,UA Negative Negative   Protein,UA Negative Negative/Trace   Glucose, UA Negative Negative   Ketones, UA Negative Negative   RBC, UA Negative Negative   Bilirubin,  UA Negative Negative   Urobilinogen, Ur 0.2 0.2 - 1.0 mg/dL   Nitrite, UA Negative Negative   Microscopic Examination Comment     Comment: Microscopic follows if indicated.   Microscopic Examination See below:     Comment: Microscopic was indicated and was performed.   Urinalysis Reflex Comment     Comment: This specimen will not reflex to a Urine Culture.  Microscopic Examination     Status: None   Collection Time: 10/27/20 11:35 AM   Urine  Result Value Ref Range   WBC, UA None seen 0 - 5 /hpf   RBC None seen 0 - 2 /hpf   Epithelial Cells (non renal) 0-10 0 - 10 /hpf   Casts None seen None seen /lpf   Bacteria, UA None seen None seen/Few    Assessment/Plan: 1. Encounter for general adult medical examination with abnormal findings Annual health maintenance exam and pap smear today. Order slip given to have routine, fasting labs drawn priro to her next visit.   2. Generalized anxiety disorder Continue citalopram every day. May take alprazolam 0.5mg  up to twice daily as needed for acute anxiety.   3. Nicotine dependence, cigarettes, uncomplicated Risks of long term smoking reviewed with the patient. She is no ready to quit at this time.   4. Routine cervical smear - IGP, Aptima HPV  5. Dysuria - UA/M w/rflx Culture, Routine  General Counseling: Charo verbalizes understanding of the findings of todays visit and agrees with plan of treatment. I have discussed any further diagnostic evaluation that may be needed or ordered today. We also reviewed her medications today. she has been encouraged to call the office with any questions or concerns that should arise related to todays visit.    Counseling:  This patient was seen by Vincent Gros FNP Collaboration with Dr Lyndon Code as a part of collaborative care agreement  Orders Placed This Encounter  Procedures  . Microscopic Examination  . UA/M w/rflx Culture, Routine    Total time spent: 45 Minutes  Time spent  includes  review of chart, medications, test results, and follow up plan with the patient.     Lavera Guise, MD  Internal Medicine

## 2020-10-28 LAB — UA/M W/RFLX CULTURE, ROUTINE
Bilirubin, UA: NEGATIVE
Glucose, UA: NEGATIVE
Ketones, UA: NEGATIVE
Leukocytes,UA: NEGATIVE
Nitrite, UA: NEGATIVE
Protein,UA: NEGATIVE
RBC, UA: NEGATIVE
Specific Gravity, UA: 1.006 (ref 1.005–1.030)
Urobilinogen, Ur: 0.2 mg/dL (ref 0.2–1.0)
pH, UA: 6 (ref 5.0–7.5)

## 2020-10-28 LAB — MICROSCOPIC EXAMINATION
Bacteria, UA: NONE SEEN
Casts: NONE SEEN /lpf
RBC, Urine: NONE SEEN /hpf (ref 0–2)
WBC, UA: NONE SEEN /hpf (ref 0–5)

## 2020-11-02 LAB — IGP, APTIMA HPV: HPV Aptima: NEGATIVE

## 2020-11-02 NOTE — Progress Notes (Signed)
Have you ever seen this before? Unsatisfactory because of excessively thick areas? Thick areas of what?

## 2020-11-28 DIAGNOSIS — Z124 Encounter for screening for malignant neoplasm of cervix: Secondary | ICD-10-CM | POA: Insufficient documentation

## 2020-11-28 DIAGNOSIS — Z1211 Encounter for screening for malignant neoplasm of colon: Secondary | ICD-10-CM | POA: Insufficient documentation

## 2021-02-02 ENCOUNTER — Other Ambulatory Visit: Payer: Self-pay | Admitting: Nurse Practitioner

## 2021-02-02 DIAGNOSIS — F411 Generalized anxiety disorder: Secondary | ICD-10-CM

## 2021-02-05 ENCOUNTER — Other Ambulatory Visit: Payer: Self-pay | Admitting: Nurse Practitioner

## 2021-02-05 DIAGNOSIS — F411 Generalized anxiety disorder: Secondary | ICD-10-CM

## 2021-02-10 ENCOUNTER — Other Ambulatory Visit: Payer: Self-pay | Admitting: Nurse Practitioner

## 2021-02-10 DIAGNOSIS — F411 Generalized anxiety disorder: Secondary | ICD-10-CM

## 2021-02-11 ENCOUNTER — Other Ambulatory Visit: Payer: Self-pay | Admitting: Nurse Practitioner

## 2021-02-11 ENCOUNTER — Other Ambulatory Visit: Payer: Self-pay

## 2021-02-11 DIAGNOSIS — F411 Generalized anxiety disorder: Secondary | ICD-10-CM

## 2021-02-25 ENCOUNTER — Telehealth: Payer: Self-pay

## 2021-02-25 ENCOUNTER — Other Ambulatory Visit: Payer: Self-pay

## 2021-02-25 ENCOUNTER — Encounter: Payer: Self-pay | Admitting: Hospice and Palliative Medicine

## 2021-02-25 ENCOUNTER — Ambulatory Visit: Payer: BLUE CROSS/BLUE SHIELD | Admitting: Hospice and Palliative Medicine

## 2021-02-25 VITALS — BP 156/84 | HR 77 | Temp 98.3°F | Resp 16 | Ht 62.0 in | Wt 142.8 lb

## 2021-02-25 DIAGNOSIS — F411 Generalized anxiety disorder: Secondary | ICD-10-CM | POA: Diagnosis not present

## 2021-02-25 DIAGNOSIS — K219 Gastro-esophageal reflux disease without esophagitis: Secondary | ICD-10-CM

## 2021-02-25 DIAGNOSIS — K602 Anal fissure, unspecified: Secondary | ICD-10-CM

## 2021-02-25 MED ORDER — ALPRAZOLAM 0.5 MG PO TABS: 0.5000 mg | ORAL_TABLET | Freq: Two times a day (BID) | ORAL | 1 refills | Status: DC | PRN

## 2021-02-25 MED ORDER — FAMOTIDINE 20 MG PO TABS: 20.0000 mg | ORAL_TABLET | Freq: Two times a day (BID) | ORAL | 3 refills | Status: DC

## 2021-02-25 MED ORDER — CITALOPRAM HYDROBROMIDE 20 MG PO TABS: 20.0000 mg | ORAL_TABLET | Freq: Two times a day (BID) | ORAL | 3 refills | Status: DC

## 2021-02-25 NOTE — Progress Notes (Signed)
[facility] [address] [place], [place] [address]  Internal MEDICINE  Office Visit Note  Patient Name: Felicia Blackwell  [id number]  [id number]  Date of Service: 02/27/2021  Chief Complaint  Patient presents with  . Anxiety    4 month f-up  . Hemorrhoids    Pt is having pain hard to sit down, a lot of discomfort    HPI Patient is here for routine follow-up Having an issue with what she feels is hemorrhoids--on past colonoscopies has been told she has internal as well as external hemorrhoids but has never underwent treatment Over the last week so has noticed rectal pain--does not recall having any recent issue with constipation or hard stools Having rectal pain with bowel movements as well as when sitting down Has not noticed any blood in her stool  Requesting refills on her medications, continues to take alprazolam as needed for anxiety, also Celexa  BP elevated today--likely contributed to pain and rectal discomfort, also explains she has been anxious about appointment and discussing her rectal pain Has been convinced that she is growing a "tail"   Current Medication: Outpatient Encounter Medications as of 02/25/2021  Medication Sig  . albuterol (VENTOLIN HFA) 108 (90 Base) MCG/ACT inhaler Inhale 2 puffs into the lungs every 6 (six) hours as needed for wheezing or shortness of breath.  Marland Kitchen ipratropium-albuterol (DUONEB) 0.5-2.5 (3) MG/3ML SOLN Take 3 mLs by nebulization every 6 (six) hours as needed.  . Lidocaine-Hydrocortisone Ace 3-0.5 % CREA Apply to effected area 2 to 3 times per day prn  . Multiple Vitamins-Minerals (MULTIVITAMIN ADULT PO) Take by mouth daily.  . ranitidine (ZANTAC) 150 MG tablet Take 150 mg by mouth daily as needed for heartburn.  . [DISCONTINUED] ALPRAZolam (XANAX) 0.5 MG tablet Take 1 tablet (0.5 mg total) by mouth 2 (two) times daily as needed for anxiety.  . [DISCONTINUED] citalopram (CELEXA) 20 MG tablet Take 1 tablet (20 mg total) by mouth  2 (two) times daily.  . [DISCONTINUED] famotidine (PEPCID) 20 MG tablet Take 1 tablet (20 mg total) by mouth 2 (two) times daily.  Marland Kitchen ALPRAZolam (XANAX) 0.5 MG tablet Take 1 tablet (0.5 mg total) by mouth 2 (two) times daily as needed for anxiety.  . citalopram (CELEXA) 20 MG tablet Take 1 tablet (20 mg total) by mouth 2 (two) times daily.  . famotidine (PEPCID) 20 MG tablet Take 1 tablet (20 mg total) by mouth 2 (two) times daily.   No facility-administered encounter medications on file as of 02/25/2021.    Surgical History: Past Surgical History:  Procedure Laterality Date  . TUBAL LIGATION      Medical History: Past Medical History:  Diagnosis Date  . Anxiety   . Depression   . GERD (gastroesophageal reflux disease)     Family History: Family History  Problem Relation Age of Onset  . Hyperlipidemia Mother   . Macular degeneration Mother   . Glaucoma Mother   . Hyperlipidemia Father   . Aneurysm Father     Social History   Socioeconomic History  . Marital status: Married    Spouse name: Not on file  . Number of children: Not on file  . Years of education: Not on file  . Highest education level: Not on file  Occupational History  . Not on file  Tobacco Use  . Smoking status: Current Every Day Smoker    Packs/day: 1.00    Types: Cigarettes  . Smokeless tobacco: Never Used  .  Tobacco comment: pt smoking 10 a day 02/25/21  Vaping Use  . Vaping Use: Never used  Substance and Sexual Activity  . Alcohol use: Not Currently  . Drug use: Not Currently  . Sexual activity: Not on file  Other Topics Concern  . Not on file  Social History Narrative  . Not on file   Social Determinants of Health   Financial Resource Strain: Not on file  Food Insecurity: Not on file  Transportation Needs: Not on file  Physical Activity: Not on file  Stress: Not on file  Social Connections: Not on file  Intimate Partner Violence: Not on file      Review of Systems   Constitutional: Negative for chills, diaphoresis and fatigue.  HENT: Negative for ear pain, postnasal drip and sinus pressure.   Eyes: Negative for photophobia, discharge, redness, itching and visual disturbance.  Respiratory: Negative for cough, shortness of breath and wheezing.   Cardiovascular: Negative for chest pain, palpitations and leg swelling.  Gastrointestinal: Positive for rectal pain. Negative for abdominal pain, constipation, diarrhea, nausea and vomiting.  Genitourinary: Negative for dysuria and flank pain.  Musculoskeletal: Negative for arthralgias, back pain, gait problem and neck pain.  Skin: Negative for color change.  Allergic/Immunologic: Negative for environmental allergies and food allergies.  Neurological: Negative for dizziness and headaches.  Hematological: Does not bruise/bleed easily.  Psychiatric/Behavioral: Negative for agitation, behavioral problems (depression) and hallucinations.    Vital Signs: BP (!) 156/84   Pulse 77   Temp 98.3 F (36.8 C)   Resp 16   Ht 5\' 2"  (1.575 m)   Wt 142 lb 12.8 oz (64.8 kg)   SpO2 96%   BMI 26.12 kg/m    Physical Exam Vitals reviewed.  Constitutional:      Appearance: Normal appearance. She is normal weight.  Cardiovascular:     Rate and Rhythm: Normal rate and regular rhythm.     Pulses: Normal pulses.     Heart sounds: Normal heart sounds.  Pulmonary:     Effort: Pulmonary effort is normal.     Breath sounds: Normal breath sounds.  Abdominal:     General: Abdomen is flat.     Palpations: Abdomen is soft.  Genitourinary:    Rectum: Tenderness and external hemorrhoid present.     Comments: Tenderness on palpation to rectal area and above rectum No evidence of bleeding Musculoskeletal:        General: Normal range of motion.     Cervical back: Normal range of motion.  Skin:    General: Skin is warm.  Neurological:     General: No focal deficit present.     Mental Status: She is alert and oriented to  person, place, and time. Mental status is at baseline.  Psychiatric:        Mood and Affect: Mood normal.        Behavior: Behavior normal.        Thought Content: Thought content normal.        Judgment: Judgment normal.    Assessment/Plan: 1. Anal fissure Evidence of external hemorrhoids, possible fissure due to significant tenderness to palpation Referral to general surgery for further evaluation and management - Ambulatory referral to General Surgery  2. Generalized anxiety disorder Requesting refills, will need to discuss weaning alprazolam dosing and use at next visit [place] Controlled Substance Database was reviewed by me for overdose risk score (ORS) Reviewed risks and possible side effects associated with taking opiates, benzodiazepines and other CNS  depressants. Combination of these could cause dizziness and drowsiness. Advised patient not to drive or operate machinery when taking these medications, as patient's and other's life can be at risk and will have consequences. Patient verbalized understanding in this matter. Dependence and abuse for these drugs will be monitored closely. A Controlled substance policy and procedure is on file which allows [facility] to order a urine drug screen test at any visit. Patient understands and agrees with the plan - citalopram (CELEXA) 20 MG tablet; Take 1 tablet (20 mg total) by mouth 2 (two) times daily.  Dispense: 60 tablet; Refill: 3 - ALPRAZolam (XANAX) 0.5 MG tablet; Take 1 tablet (0.5 mg total) by mouth 2 (two) times daily as needed for anxiety.  Dispense: 60 tablet; Refill: 1  3. Gastroesophageal reflux disease without esophagitis Stable, requesting refills - famotidine (PEPCID) 20 MG tablet; Take 1 tablet (20 mg total) by mouth 2 (two) times daily.  Dispense: 60 tablet; Refill: 3  General Counseling: Carla verbalizes understanding of the findings of todays visit and agrees with plan of treatment. I have discussed any further  diagnostic evaluation that may be needed or ordered today. We also reviewed her medications today. she has been encouraged to call the office with any questions or concerns that should arise related to todays visit.    Orders Placed This Encounter  Procedures  . Ambulatory referral to General Surgery    Meds ordered this encounter  Medications  . citalopram (CELEXA) 20 MG tablet    Sig: Take 1 tablet (20 mg total) by mouth 2 (two) times daily.    Dispense:  60 tablet    Refill:  3  . ALPRAZolam (XANAX) 0.5 MG tablet    Sig: Take 1 tablet (0.5 mg total) by mouth 2 (two) times daily as needed for anxiety.    Dispense:  60 tablet    Refill:  1  . famotidine (PEPCID) 20 MG tablet    Sig: Take 1 tablet (20 mg total) by mouth 2 (two) times daily.    Dispense:  60 tablet    Refill:  3    Time spent: 30 Minutes Time spent includes review of chart, medications, test results and follow-up plan with the patient.  This patient was seen by Leeanne Deed AGNP-C in Collaboration with Dr Lyndon Code as a part of collaborative care agreement     Lubertha Basque. Kandi Brusseau AGNP-C Internal medicine

## 2021-02-25 NOTE — Telephone Encounter (Signed)
Faxed over referral to General Surgery at [facility] at [phone]. Toni Amend

## 2021-02-27 ENCOUNTER — Encounter: Payer: Self-pay | Admitting: Hospice and Palliative Medicine

## 2021-04-09 ENCOUNTER — Ambulatory Visit: Payer: BLUE CROSS/BLUE SHIELD | Admitting: Nurse Practitioner

## 2021-04-29 ENCOUNTER — Other Ambulatory Visit: Payer: Self-pay

## 2021-04-29 DIAGNOSIS — F411 Generalized anxiety disorder: Secondary | ICD-10-CM

## 2021-04-29 MED ORDER — ALPRAZOLAM 0.5 MG PO TABS: 0.5000 mg | ORAL_TABLET | Freq: Two times a day (BID) | ORAL | 1 refills | Status: DC | PRN

## 2021-07-05 ENCOUNTER — Other Ambulatory Visit: Payer: Self-pay

## 2021-07-05 ENCOUNTER — Ambulatory Visit: Payer: BLUE CROSS/BLUE SHIELD | Admitting: Nurse Practitioner

## 2021-07-05 ENCOUNTER — Encounter: Payer: Self-pay | Admitting: Nurse Practitioner

## 2021-07-05 VITALS — BP 160/82 | HR 60 | Temp 97.7°F | Resp 16 | Ht 62.0 in | Wt 145.0 lb

## 2021-07-05 DIAGNOSIS — K644 Residual hemorrhoidal skin tags: Secondary | ICD-10-CM | POA: Diagnosis not present

## 2021-07-05 DIAGNOSIS — Z79899 Other long term (current) drug therapy: Secondary | ICD-10-CM

## 2021-07-05 DIAGNOSIS — R062 Wheezing: Secondary | ICD-10-CM | POA: Diagnosis not present

## 2021-07-05 DIAGNOSIS — I10 Essential (primary) hypertension: Secondary | ICD-10-CM

## 2021-07-05 DIAGNOSIS — K648 Other hemorrhoids: Secondary | ICD-10-CM

## 2021-07-05 DIAGNOSIS — F411 Generalized anxiety disorder: Secondary | ICD-10-CM | POA: Diagnosis not present

## 2021-07-05 LAB — POCT URINE DRUG SCREEN
Methylenedioxyamphetamine: NOT DETECTED
POC Amphetamine UR: NOT DETECTED
POC BENZODIAZEPINES UR: NOT DETECTED
POC Barbiturate UR: NOT DETECTED
POC Cocaine UR: NOT DETECTED
POC Ecstasy UR: NOT DETECTED
POC Marijuana UR: NOT DETECTED
POC Methadone UR: NOT DETECTED
POC Methamphetamine UR: NOT DETECTED
POC Opiate Ur: NOT DETECTED
POC Oxycodone UR: NOT DETECTED
POC PHENCYCLIDINE UR: NOT DETECTED
POC TRICYCLICS UR: NOT DETECTED

## 2021-07-05 MED ORDER — CITALOPRAM HYDROBROMIDE 20 MG PO TABS: 20.0000 mg | ORAL_TABLET | Freq: Two times a day (BID) | ORAL | 3 refills | Status: DC

## 2021-07-05 MED ORDER — HYDROCORTISONE ACETATE 25 MG RE SUPP: 25.0000 mg | Freq: Two times a day (BID) | RECTAL | 2 refills | Status: DC

## 2021-07-05 MED ORDER — ALBUTEROL SULFATE HFA 108 (90 BASE) MCG/ACT IN AERS: 2.0000 | INHALATION_SPRAY | Freq: Four times a day (QID) | RESPIRATORY_TRACT | 3 refills | Status: DC | PRN

## 2021-07-05 MED ORDER — ALPRAZOLAM 0.5 MG PO TABS: 0.5000 mg | ORAL_TABLET | Freq: Two times a day (BID) | ORAL | 2 refills | Status: DC | PRN

## 2021-07-05 MED ORDER — AMLODIPINE BESYLATE 5 MG PO TABS: 5.0000 mg | ORAL_TABLET | Freq: Every day | ORAL | 0 refills | Status: DC

## 2021-07-05 NOTE — Progress Notes (Signed)
[facility] [place], [place] [address]  Internal MEDICINE  Office Visit Note  Patient Name: Felicia Blackwell  [id number]  [id number]  Date of Service: 07/05/2021  Chief Complaint  Patient presents with   Follow-up    Discuss meds   Depression   Gastroesophageal Reflux   Anxiety   Quality Metric Gaps    dexa    HPI Felicia Blackwell presents for a follow up visit for medication review, anxiety, depression, elevated blood pressure. She reports having internal and external hemorrhoids according to a general surgeon she had met with previously. The creams and witch hazel only temporarily alleviate symptoms and do not help with the internal hemorrhoids.  -Blood pressure is significantly elevated, and she is not currently taking any medication for it.  -takes alprazolam for anxiety, due for UDS today.  Use desitin diaper rash cream for external hemorrhoids mix with preparation -copay cost per office visit is $186 - need to find out how to make office visits more affordable. Can do self pay?    Current Medication: Outpatient Encounter Medications as of 07/05/2021  Medication Sig   amLODipine (NORVASC) 5 MG tablet Take 1 tablet (5 mg total) by mouth daily.   famotidine (PEPCID) 20 MG tablet Take 1 tablet (20 mg total) by mouth 2 (two) times daily.   hydrocortisone (ANUSOL-HC) 25 MG suppository Place 1 suppository (25 mg total) rectally 2 (two) times daily.   ipratropium-albuterol (DUONEB) 0.5-2.5 (3) MG/3ML SOLN Take 3 mLs by nebulization every 6 (six) hours as needed.   Lidocaine-Hydrocortisone Ace 3-0.5 % CREA Apply to effected area 2 to 3 times per day prn   Multiple Vitamins-Minerals (MULTIVITAMIN ADULT PO) Take by mouth daily.   [DISCONTINUED] albuterol (VENTOLIN HFA) 108 (90 Base) MCG/ACT inhaler Inhale 2 puffs into the lungs every 6 (six) hours as needed for wheezing or shortness of breath.   [DISCONTINUED] ALPRAZolam (XANAX) 0.5 MG tablet Take 1 tablet (0.5 mg total)  by mouth 2 (two) times daily as needed for anxiety.   [DISCONTINUED] citalopram (CELEXA) 20 MG tablet Take 1 tablet (20 mg total) by mouth 2 (two) times daily.   albuterol (VENTOLIN HFA) 108 (90 Base) MCG/ACT inhaler Inhale 2 puffs into the lungs every 6 (six) hours as needed for wheezing or shortness of breath.   ALPRAZolam (XANAX) 0.5 MG tablet Take 1 tablet (0.5 mg total) by mouth 2 (two) times daily as needed for anxiety.   citalopram (CELEXA) 20 MG tablet Take 1 tablet (20 mg total) by mouth 2 (two) times daily.   [DISCONTINUED] ranitidine (ZANTAC) 150 MG tablet Take 150 mg by mouth daily as needed for heartburn. (Patient not taking: Reported on 07/05/2021)   No facility-administered encounter medications on file as of 07/05/2021.    Surgical History: Past Surgical History:  Procedure Laterality Date   TUBAL LIGATION      Medical History: Past Medical History:  Diagnosis Date   Anxiety    Depression    GERD (gastroesophageal reflux disease)     Family History: Family History  Problem Relation Age of Onset   Hyperlipidemia Mother    Macular degeneration Mother    Glaucoma Mother    Hyperlipidemia Father    Aneurysm Father     Social History   Socioeconomic History   Marital status: Married    Spouse name: Not on file   Number of children: Not on file   Years of education: Not on file   Highest education level: Not  on file  Occupational History   Not on file  Tobacco Use   Smoking status: Every Day    Packs/day: 1.00    Types: Cigarettes   Smokeless tobacco: Never   Tobacco comments:    pt smoking 10 a day 02/25/21  Vaping Use   Vaping Use: Never used  Substance and Sexual Activity   Alcohol use: Not Currently   Drug use: Not Currently   Sexual activity: Not on file  Other Topics Concern   Not on file  Social History Narrative   Not on file   Social Determinants of Health   Financial Resource Strain: Not on file  Food Insecurity: Not on file   Transportation Needs: Not on file  Physical Activity: Not on file  Stress: Not on file  Social Connections: Not on file  Intimate Partner Violence: Not on file      Review of Systems  Constitutional:  Negative for chills, fatigue and unexpected weight change.  HENT:  Negative for congestion, rhinorrhea, sneezing and sore throat.   Eyes:  Negative for redness.  Respiratory:  Negative for cough, chest tightness and shortness of breath.   Cardiovascular:  Negative for chest pain and palpitations.  Gastrointestinal:  Negative for abdominal pain, constipation, diarrhea, nausea and vomiting.  Genitourinary:  Negative for dysuria and frequency.  Musculoskeletal:  Negative for arthralgias, back pain, joint swelling and neck pain.  Skin:  Negative for rash.  Neurological: Negative.  Negative for tremors and numbness.  Hematological:  Negative for adenopathy. Does not bruise/bleed easily.  Psychiatric/Behavioral:  Negative for behavioral problems (Depression), sleep disturbance and suicidal ideas. The patient is not nervous/anxious.    Vital Signs: BP (!) 160/82 Comment: 182/86  Pulse 60   Temp 97.7 F (36.5 C)   Resp 16   Ht _0  (1.575 m)   Wt 145 lb (65.8 kg)   SpO2 98%   BMI 26.52 kg/m    Physical Exam Vitals reviewed.  Constitutional:      General: She is not in acute distress.    Appearance: Normal appearance. She is not ill-appearing.  HENT:     Head: Normocephalic and atraumatic.  Cardiovascular:     Rate and Rhythm: Normal rate and regular rhythm.  Pulmonary:     Effort: Pulmonary effort is normal. No respiratory distress.  Skin:    General: Skin is warm and dry.     Capillary Refill: Capillary refill takes less than 2 seconds.  Neurological:     Mental Status: She is alert and oriented to person, place, and time.  Psychiatric:        Mood and Affect: Mood normal.        Behavior: Behavior normal.     Assessment/Plan: 1. Encounter for long-term (current)  use of high-risk medication UDS was negative, patient does not take the alprazolam every day. She also had run out of her alprazolam a few days before.  [place] Controlled Substance Database was reviewed by me for overdose risk score (ORS). ORS is 180. - POCT Urine Drug Screen  2. Primary hypertension Started patient on amlodipine 5 mg daily. Requested patient return in 4 weeks to follow up on blood pressure but patient is unable to afford the copay more than once every few months. Discussed an informal call to check in with patient and instructed patient to check her blood pressure at least once daily and write them down.  - amLODipine (NORVASC) 5 MG tablet; Take 1 tablet (5 mg  total) by mouth daily.  Dispense: 30 tablet; Refill: 0  3. Internal and external hemorrhoids without complication Suppositiory prescription sent to pharmacy to help with relief of internal hemorrhoids, use as prescribed. For external hemorrhoids, instructed patient to take OTC hemorrhoid cream or ointment  and mix with zinc oxide diaper rash cream, apply 4 times daily as needed for symptom relief.  - hydrocortisone (ANUSOL-HC) 25 MG suppository; Place 1 suppository (25 mg total) rectally 2 (two) times daily.  Dispense: 24 suppository; Refill: 2  4. Generalized anxiety disorder Stable with current medications, refills ordered.  - citalopram (CELEXA) 20 MG tablet; Take 1 tablet (20 mg total) by mouth 2 (two) times daily.  Dispense: 60 tablet; Refill: 3 - ALPRAZolam (XANAX) 0.5 MG tablet; Take 1 tablet (0.5 mg total) by mouth 2 (two) times daily as needed for anxiety.  Dispense: 60 tablet; Refill: 2  5. Wheezing Refill ordered per patient request, current rescue inhaler is expired.  - albuterol (VENTOLIN HFA) 108 (90 Base) MCG/ACT inhaler; Inhale 2 puffs into the lungs every 6 (six) hours as needed for wheezing or shortness of breath.  Dispense: 18 g; Refill: 3   General Counseling: Alvina verbalizes understanding of the  findings of todays visit and agrees with plan of treatment. I have discussed any further diagnostic evaluation that may be needed or ordered today. We also reviewed her medications today. she has been encouraged to call the office with any questions or concerns that should arise related to todays visit.    Orders Placed This Encounter  Procedures   POCT Urine Drug Screen    Meds ordered this encounter  Medications   hydrocortisone (ANUSOL-HC) 25 MG suppository    Sig: Place 1 suppository (25 mg total) rectally 2 (two) times daily.    Dispense:  24 suppository    Refill:  2   citalopram (CELEXA) 20 MG tablet    Sig: Take 1 tablet (20 mg total) by mouth 2 (two) times daily.    Dispense:  60 tablet    Refill:  3   albuterol (VENTOLIN HFA) 108 (90 Base) MCG/ACT inhaler    Sig: Inhale 2 puffs into the lungs every 6 (six) hours as needed for wheezing or shortness of breath.    Dispense:  18 g    Refill:  3   ALPRAZolam (XANAX) 0.5 MG tablet    Sig: Take 1 tablet (0.5 mg total) by mouth 2 (two) times daily as needed for anxiety.    Dispense:  60 tablet    Refill:  2   amLODipine (NORVASC) 5 MG tablet    Sig: Take 1 tablet (5 mg total) by mouth daily.    Dispense:  30 tablet    Refill:  0    No follow-ups on file.   Total time spent:30 Minutes Time spent includes review of chart, medications, test results, and follow up plan with the patient.    Controlled Substance Database was reviewed by me.  This patient was seen by Jonetta Osgood, FNP-C in collaboration with Dr. Clayborn Bigness as a part of collaborative care agreement.   Eilene Voigt R. Valetta Fuller, MSN, FNP-C Internal medicine

## 2021-07-14 ENCOUNTER — Telehealth: Payer: Self-pay

## 2021-07-14 NOTE — Telephone Encounter (Signed)
PA sent for Citalopram 20 mg twice a day and for hydrocortisone acetate suppositories

## 2021-08-07 ENCOUNTER — Other Ambulatory Visit: Payer: Self-pay | Admitting: Nurse Practitioner

## 2021-08-07 DIAGNOSIS — F411 Generalized anxiety disorder: Secondary | ICD-10-CM

## 2021-08-08 ENCOUNTER — Other Ambulatory Visit: Payer: Self-pay

## 2021-08-09 NOTE — Telephone Encounter (Signed)
Med sent to pharmacy.

## 2021-09-21 ENCOUNTER — Other Ambulatory Visit: Payer: Self-pay | Admitting: Nurse Practitioner

## 2021-09-21 DIAGNOSIS — F411 Generalized anxiety disorder: Secondary | ICD-10-CM

## 2021-10-04 ENCOUNTER — Other Ambulatory Visit: Payer: Self-pay

## 2021-10-04 ENCOUNTER — Ambulatory Visit (INDEPENDENT_AMBULATORY_CARE_PROVIDER_SITE_OTHER): Payer: BLUE CROSS/BLUE SHIELD | Admitting: Nurse Practitioner

## 2021-10-04 ENCOUNTER — Encounter: Payer: Self-pay | Admitting: Nurse Practitioner

## 2021-10-04 VITALS — BP 140/70 | HR 60 | Temp 98.4°F | Resp 16 | Ht 62.0 in | Wt 145.4 lb

## 2021-10-04 DIAGNOSIS — F411 Generalized anxiety disorder: Secondary | ICD-10-CM | POA: Diagnosis not present

## 2021-10-04 DIAGNOSIS — Z780 Asymptomatic menopausal state: Secondary | ICD-10-CM | POA: Diagnosis not present

## 2021-10-04 DIAGNOSIS — Z0001 Encounter for general adult medical examination with abnormal findings: Secondary | ICD-10-CM | POA: Diagnosis not present

## 2021-10-04 DIAGNOSIS — F17210 Nicotine dependence, cigarettes, uncomplicated: Secondary | ICD-10-CM | POA: Diagnosis not present

## 2021-10-04 DIAGNOSIS — R300 Dysuria: Secondary | ICD-10-CM

## 2021-10-04 DIAGNOSIS — I10 Essential (primary) hypertension: Secondary | ICD-10-CM | POA: Diagnosis not present

## 2021-10-04 DIAGNOSIS — E559 Vitamin D deficiency, unspecified: Secondary | ICD-10-CM

## 2021-10-04 DIAGNOSIS — E782 Mixed hyperlipidemia: Secondary | ICD-10-CM

## 2021-10-04 DIAGNOSIS — K644 Residual hemorrhoidal skin tags: Secondary | ICD-10-CM

## 2021-10-04 DIAGNOSIS — K648 Other hemorrhoids: Secondary | ICD-10-CM

## 2021-10-04 MED ORDER — AMLODIPINE BESYLATE 5 MG PO TABS: 5.0000 mg | ORAL_TABLET | Freq: Every day | ORAL | 1 refills | Status: DC

## 2021-10-04 MED ORDER — CITALOPRAM HYDROBROMIDE 40 MG PO TABS: 20.0000 mg | ORAL_TABLET | Freq: Two times a day (BID) | ORAL | 1 refills | Status: DC

## 2021-10-04 MED ORDER — ALPRAZOLAM 0.5 MG PO TABS: 0.5000 mg | ORAL_TABLET | Freq: Two times a day (BID) | ORAL | 2 refills | Status: DC | PRN

## 2021-10-04 NOTE — Progress Notes (Signed)
[facility] [place], [place] [address]  Internal MEDICINE  Office Visit Note  Patient Name: Felicia Blackwell  [id number]  [id number]  Date of Service: 10/04/2021  Chief Complaint  Patient presents with   Annual Exam    refills   Depression   Gastroesophageal Reflux   Anxiety    HPI Felicia Blackwell presents for an annual well visit and physical exam. Felicia Blackwell is a well appearing 65 yo female. At her previous office visit her blood pressure was poorly controlled and amlodipine 5 mg daily was prescribed. Her blood pressure is much improved. Felicia Blackwell is due for labs and BMD screening. Felicia Blackwell is due for routine colonoscopy in 2024. Her last mammogram was done in November last year. Felicia Blackwell has received her letter from [facility] and will call and schedule her mammogram. Felicia Blackwell is not due for any other preventive screenings. Felicia Blackwell is still smoking approx 10 cigarettes per day and is not ready to quit. Felicia Blackwell has declined recommended vaccinations for now.  At her previous visit, patient reported having internal and external hemorrhoids. A hydrocortisone suppository was prescribed but it was too expensive per patient. Instead, Felicia Blackwell has continued to use OTC products for symptomatic relief including witch hazel and other topical treatments.  Felicia Blackwell is requesting refills today.     Current Medication: Outpatient Encounter Medications as of 10/04/2021  Medication Sig   albuterol (VENTOLIN HFA) 108 (90 Base) MCG/ACT inhaler Inhale 2 puffs into the lungs every 6 (six) hours as needed for wheezing or shortness of breath.   famotidine (PEPCID) 20 MG tablet Take 1 tablet (20 mg total) by mouth 2 (two) times daily.   hydrocortisone (ANUSOL-HC) 25 MG suppository Place 1 suppository (25 mg total) rectally 2 (two) times daily.   ipratropium-albuterol (DUONEB) 0.5-2.5 (3) MG/3ML SOLN Take 3 mLs by nebulization every 6 (six) hours as needed.   Multiple Vitamins-Minerals (MULTIVITAMIN ADULT PO) Take by mouth daily.    [DISCONTINUED] ALPRAZolam (XANAX) 0.5 MG tablet TAKE 1 TABLET BY MOUTH TWICE DAILY AS NEEDED FOR ANXIETY   [DISCONTINUED] amLODipine (NORVASC) 5 MG tablet Take 1 tablet (5 mg total) by mouth daily.   [DISCONTINUED] citalopram (CELEXA) 40 MG tablet TAKE 1/2 TABLET BY MOUTH TWICE DAILY   [DISCONTINUED] Lidocaine-Hydrocortisone Ace 3-0.5 % CREA Apply to effected area 2 to 3 times per day prn   ALPRAZolam (XANAX) 0.5 MG tablet Take 1 tablet (0.5 mg total) by mouth 2 (two) times daily as needed for anxiety or sleep. for anxiety   amLODipine (NORVASC) 5 MG tablet Take 1 tablet (5 mg total) by mouth daily.   citalopram (CELEXA) 40 MG tablet Take 0.5 tablets (20 mg total) by mouth 2 (two) times daily.   No facility-administered encounter medications on file as of 10/04/2021.    Surgical History: Past Surgical History:  Procedure Laterality Date   TUBAL LIGATION      Medical History: Past Medical History:  Diagnosis Date   Anxiety    Depression    GERD (gastroesophageal reflux disease)     Family History: Family History  Problem Relation Age of Onset   Heart disease Mother    Hyperlipidemia Mother    Macular degeneration Mother    Glaucoma Mother    Hyperlipidemia Father    Aneurysm Father     Social History   Socioeconomic History   Marital status: Married    Spouse name: Not on file   Number of children: Not on file   Years of education: Not  on file   Highest education level: Not on file  Occupational History   Not on file  Tobacco Use   Smoking status: Every Day    Packs/day: 1.00    Types: Cigarettes   Smokeless tobacco: Never   Tobacco comments:    pt smoking 10 a day 02/25/21  Vaping Use   Vaping Use: Never used  Substance and Sexual Activity   Alcohol use: Not Currently   Drug use: Not Currently   Sexual activity: Not on file  Other Topics Concern   Not on file  Social History Narrative   Not on file   Social Determinants of Health   Financial Resource  Strain: Not on file  Food Insecurity: Not on file  Transportation Needs: Not on file  Physical Activity: Not on file  Stress: Not on file  Social Connections: Not on file  Intimate Partner Violence: Not on file      Review of Systems  Constitutional:  Negative for activity change, appetite change, chills, fatigue, fever and unexpected weight change.  HENT: Negative.  Negative for congestion, ear pain, rhinorrhea, sore throat and trouble swallowing.   Eyes: Negative.   Respiratory: Negative.  Negative for cough, chest tightness, shortness of breath and wheezing.   Cardiovascular: Negative.  Negative for chest pain.  Gastrointestinal: Negative.  Negative for abdominal pain, blood in stool, constipation, diarrhea, nausea and vomiting.  Endocrine: Negative.   Genitourinary: Negative.  Negative for difficulty urinating, dysuria, frequency, hematuria and urgency.  Musculoskeletal: Negative.  Negative for arthralgias, back pain, joint swelling, myalgias and neck pain.  Skin: Negative.  Negative for rash and wound.  Allergic/Immunologic: Negative.  Negative for immunocompromised state.  Neurological: Negative.  Negative for dizziness, seizures, numbness and headaches.  Hematological: Negative.   Psychiatric/Behavioral:  Positive for depression. Negative for behavioral problems, self-injury and suicidal ideas. The patient is not nervous/anxious.    Vital Signs: BP 140/70   Pulse 60   Temp 98.4 F (36.9 C)   Resp 16   Ht 5' 2"  (1.575 m)   Wt 145 lb 6.4 oz (66 kg)   SpO2 98%   BMI 26.59 kg/m    Physical Exam Vitals reviewed.  Constitutional:      General: Felicia Blackwell is awake. Felicia Blackwell is not in acute distress.    Appearance: Normal appearance. Felicia Blackwell is well-developed, well-groomed and normal weight. Felicia Blackwell is not ill-appearing or diaphoretic.  HENT:     Head: Normocephalic and atraumatic.     Right Ear: Tympanic membrane, ear canal and external ear normal.     Left Ear: Tympanic membrane, ear  canal and external ear normal.     Nose: Nose normal. No congestion or rhinorrhea.     Mouth/Throat:     Lips: Pink.     Mouth: Mucous membranes are moist.     Pharynx: Oropharynx is clear. Uvula midline. No oropharyngeal exudate or posterior oropharyngeal erythema.  Eyes:     General: Lids are normal. Vision grossly intact. Gaze aligned appropriately. No scleral icterus.       Right eye: No discharge.        Left eye: No discharge.     Extraocular Movements: Extraocular movements intact.     Conjunctiva/sclera: Conjunctivae normal.     Pupils: Pupils are equal, round, and reactive to light.     Funduscopic exam:    Right eye: Red reflex present.        Left eye: Red reflex present. Neck:  Thyroid: No thyromegaly.     Vascular: No JVD.     Trachea: Trachea and phonation normal. No tracheal deviation.  Cardiovascular:     Rate and Rhythm: Normal rate and regular rhythm.     Heart sounds: Normal heart sounds, S1 normal and S2 normal. No murmur heard.   No friction rub. No gallop.  Pulmonary:     Effort: Pulmonary effort is normal. No accessory muscle usage or respiratory distress.     Breath sounds: Normal breath sounds and air entry. No stridor. No wheezing or rales.  Chest:     Chest wall: No tenderness.     Comments: Declined clinical breast exam, gets annual mammograms.  Abdominal:     General: Bowel sounds are normal. There is no distension.     Palpations: Abdomen is soft. There is no shifting dullness, fluid wave, mass or pulsatile mass.     Tenderness: There is no abdominal tenderness. There is no guarding or rebound.  Musculoskeletal:        General: No tenderness or deformity. Normal range of motion.     Cervical back: Normal range of motion and neck supple.  Lymphadenopathy:     Cervical: No cervical adenopathy.  Skin:    General: Skin is warm and dry.     Capillary Refill: Capillary refill takes less than 2 seconds.     Coloration: Skin is not pale.      Findings: No erythema or rash.  Neurological:     Mental Status: Felicia Blackwell is alert and oriented to person, place, and time.     Cranial Nerves: No cranial nerve deficit.     Motor: No abnormal muscle tone.     Coordination: Coordination normal.     Gait: Gait normal.     Deep Tendon Reflexes: Reflexes are normal and symmetric.  Psychiatric:        Mood and Affect: Mood and affect normal.        Behavior: Behavior normal. Behavior is cooperative.        Thought Content: Thought content normal.        Judgment: Judgment normal.       Assessment/Plan: 1. Encounter for general adult medical examination with abnormal findings Age-appropriate preventive screenings and vaccinations discussed, annual physical exam completed. Routine labs for health maintenance ordered, see below. PHM updated.   2. Primary hypertension Improved with amlodipine, continue as prescribed, refills ordered. Routine labs ordered.  - amLODipine (NORVASC) 5 MG tablet; Take 1 tablet (5 mg total) by mouth daily.  Dispense: 90 tablet; Refill: 1 - CBC with Differential/Platelet - CMP14+EGFR - TSH + free T4  3. Asymptomatic menopausal state Routine labs orderd, and dexa scan for BMD screening, patient will call [facility].  - DG Bone Density; Future - CBC with Differential/Platelet - CMP14+EGFR - TSH + free T4  4. Nicotine dependence, cigarettes, uncomplicated Smokes approx 10 cigarettes per day, not interested in quitting right now, declined smoking cessation counseling.  5. Generalized anxiety disorder Stable, medication refills ordered - ALPRAZolam (XANAX) 0.5 MG tablet; Take 1 tablet (0.5 mg total) by mouth 2 (two) times daily as needed for anxiety or sleep. for anxiety  Dispense: 60 tablet; Refill: 2 - citalopram (CELEXA) 40 MG tablet; Take 0.5 tablets (20 mg total) by mouth 2 (two) times daily.  Dispense: 90 tablet; Refill: 1  6. Internal and external hemorrhoids without complication Stable,  using OTC products for symptomatic relief.   7. Vitamin D deficiency Routine  lab ordered - Vitamin D (25 hydroxy)  8. Mixed hyperlipidemia Routine labs ordered - TSH + free T4 - Lipid Profile  9. Dysuria Routine urinalysis done - UA/M w/rflx Culture, Routine - Microscopic Examination      General Counseling: Felicia Blackwell verbalizes understanding of the findings of todays visit and agrees with plan of treatment. I have discussed any further diagnostic evaluation that may be needed or ordered today. We also reviewed her medications today. Felicia Blackwell has been encouraged to call the office with any questions or concerns that should arise related to todays visit.    Orders Placed This Encounter  Procedures   Microscopic Examination   DG Bone Density   CBC with Differential/Platelet   CMP14+EGFR   TSH + free T4   Lipid Profile   Vitamin D (25 hydroxy)   UA/M w/rflx Culture, Routine    Meds ordered this encounter  Medications   ALPRAZolam (XANAX) 0.5 MG tablet    Sig: Take 1 tablet (0.5 mg total) by mouth 2 (two) times daily as needed for anxiety or sleep. for anxiety    Dispense:  60 tablet    Refill:  2    Patient can fill in December, Felicia Blackwell just filled the xanax for november   citalopram (CELEXA) 40 MG tablet    Sig: Take 0.5 tablets (20 mg total) by mouth 2 (two) times daily.    Dispense:  90 tablet    Refill:  1   amLODipine (NORVASC) 5 MG tablet    Sig: Take 1 tablet (5 mg total) by mouth daily.    Dispense:  90 tablet    Refill:  1    Return in about 4 months (around 02/01/2022) for F/U, anxiety med refill, Jamae Tison PCP.   Total time spent:30 Minutes Time spent includes review of chart, medications, test results, and follow up plan with the patient.   [place] Controlled Substance Database was reviewed by me.  This patient was seen by Jonetta Osgood, FNP-C in collaboration with Dr. Clayborn Bigness as a part of collaborative care agreement.  Cathe Bilger R. Valetta Fuller, MSN,  FNP-C Internal medicine

## 2021-10-05 LAB — UA/M W/RFLX CULTURE, ROUTINE
Bilirubin, UA: NEGATIVE
Glucose, UA: NEGATIVE
Ketones, UA: NEGATIVE
Leukocytes,UA: NEGATIVE
Nitrite, UA: NEGATIVE
Protein,UA: NEGATIVE
RBC, UA: NEGATIVE
Specific Gravity, UA: 1.005 (ref 1.005–1.030)
Urobilinogen, Ur: 0.2 mg/dL (ref 0.2–1.0)
pH, UA: 5.5 (ref 5.0–7.5)

## 2021-10-05 LAB — MICROSCOPIC EXAMINATION
Bacteria, UA: NONE SEEN
Casts: NONE SEEN /lpf
Epithelial Cells (non renal): NONE SEEN /hpf (ref 0–10)
RBC, Urine: NONE SEEN /hpf (ref 0–2)
WBC, UA: NONE SEEN /hpf (ref 0–5)

## 2021-10-21 ENCOUNTER — Encounter: Payer: BLUE CROSS/BLUE SHIELD | Admitting: Nurse Practitioner

## 2021-10-31 ENCOUNTER — Encounter: Payer: Self-pay | Admitting: Nurse Practitioner

## 2022-02-02 ENCOUNTER — Ambulatory Visit: Payer: 59 | Admitting: Nurse Practitioner

## 2022-02-02 ENCOUNTER — Encounter: Payer: Self-pay | Admitting: Nurse Practitioner

## 2022-02-02 ENCOUNTER — Other Ambulatory Visit: Payer: Self-pay

## 2022-02-02 VITALS — BP 140/80 | HR 65 | Temp 98.1°F | Resp 16 | Ht 62.0 in | Wt 143.0 lb

## 2022-02-02 DIAGNOSIS — I10 Essential (primary) hypertension: Secondary | ICD-10-CM

## 2022-02-02 DIAGNOSIS — F411 Generalized anxiety disorder: Secondary | ICD-10-CM | POA: Diagnosis not present

## 2022-02-02 DIAGNOSIS — Z658 Other specified problems related to psychosocial circumstances: Secondary | ICD-10-CM | POA: Diagnosis not present

## 2022-02-02 DIAGNOSIS — F17210 Nicotine dependence, cigarettes, uncomplicated: Secondary | ICD-10-CM | POA: Diagnosis not present

## 2022-02-02 DIAGNOSIS — Z780 Asymptomatic menopausal state: Secondary | ICD-10-CM | POA: Diagnosis not present

## 2022-02-02 DIAGNOSIS — E559 Vitamin D deficiency, unspecified: Secondary | ICD-10-CM | POA: Diagnosis not present

## 2022-02-02 DIAGNOSIS — E782 Mixed hyperlipidemia: Secondary | ICD-10-CM | POA: Diagnosis not present

## 2022-02-02 DIAGNOSIS — R69 Illness, unspecified: Secondary | ICD-10-CM | POA: Diagnosis not present

## 2022-02-02 MED ORDER — ALPRAZOLAM 0.5 MG PO TABS: 0.5000 mg | ORAL_TABLET | Freq: Two times a day (BID) | ORAL | 2 refills | Status: DC | PRN

## 2022-02-02 MED ORDER — CITALOPRAM HYDROBROMIDE 40 MG PO TABS: 20.0000 mg | ORAL_TABLET | Freq: Two times a day (BID) | ORAL | 1 refills | Status: DC

## 2022-02-02 MED ORDER — AMLODIPINE BESYLATE 5 MG PO TABS: 5.0000 mg | ORAL_TABLET | Freq: Every day | ORAL | 1 refills | Status: DC
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
## 2022-02-03 LAB — LIPID PANEL
Chol/HDL Ratio: 3.6 ratio (ref 0.0–4.4)
Cholesterol, Total: 208 mg/dL — ABNORMAL HIGH (ref 100–199)
HDL: 58 mg/dL (ref >39)
LDL Chol Calc (NIH): 129 mg/dL — ABNORMAL HIGH (ref 0–99)
Triglycerides: 120 mg/dL (ref 0–149)
VLDL Cholesterol Cal: 21 mg/dL (ref 5–40)

## 2022-02-03 LAB — CBC WITH DIFFERENTIAL/PLATELET
Basophils Absolute: 0 10*3/uL (ref 0.0–0.2)
Basos: 1 %
EOS (ABSOLUTE): 0 10*3/uL (ref 0.0–0.4)
Eos: 0 %
Hematocrit: 42.8 % (ref 34.0–46.6)
Hemoglobin: 14.4 g/dL (ref 11.1–15.9)
Immature Grans (Abs): 0 10*3/uL (ref 0.0–0.1)
Immature Granulocytes: 0 %
Lymphocytes Absolute: 2.1 10*3/uL (ref 0.7–3.1)
Lymphs: 27 %
MCH: 30.8 pg (ref 26.6–33.0)
MCHC: 33.6 g/dL (ref 31.5–35.7)
MCV: 92 fL (ref 79–97)
Monocytes Absolute: 0.4 10*3/uL (ref 0.1–0.9)
Monocytes: 5 %
Neutrophils Absolute: 5.1 10*3/uL (ref 1.4–7.0)
Neutrophils: 67 %
Platelets: 234 10*3/uL (ref 150–450)
RBC: 4.68 x10E6/uL (ref 3.77–5.28)
RDW: 13.1 % (ref 11.7–15.4)
WBC: 7.7 10*3/uL (ref 3.4–10.8)

## 2022-02-03 LAB — CMP14+EGFR
ALT: 8 IU/L (ref 0–32)
AST: 11 IU/L (ref 0–40)
Albumin/Globulin Ratio: 1.8 (ref 1.2–2.2)
Albumin: 4.4 g/dL (ref 3.8–4.8)
Alkaline Phosphatase: 88 IU/L (ref 44–121)
BUN/Creatinine Ratio: 11 — ABNORMAL LOW (ref 12–28)
BUN: 10 mg/dL (ref 8–27)
Bilirubin Total: 0.5 mg/dL (ref 0.0–1.2)
CO2: 28 mmol/L (ref 20–29)
Calcium: 9.6 mg/dL (ref 8.7–10.3)
Chloride: 100 mmol/L (ref 96–106)
Creatinine, Ser: 0.93 mg/dL (ref 0.57–1.00)
Globulin, Total: 2.5 g/dL (ref 1.5–4.5)
Glucose: 112 mg/dL — ABNORMAL HIGH (ref 70–99)
Potassium: 4.3 mmol/L (ref 3.5–5.2)
Sodium: 142 mmol/L (ref 134–144)
Total Protein: 6.9 g/dL (ref 6.0–8.5)
eGFR: 68 mL/min/{1.73_m2} (ref >59)

## 2022-02-03 LAB — TSH+FREE T4
Free T4: 1.08 ng/dL (ref 0.82–1.77)
TSH: 0.875 u[IU]/mL (ref 0.450–4.500)

## 2022-02-03 LAB — VITAMIN D 25 HYDROXY (VIT D DEFICIENCY, FRACTURES): Vit D, 25-Hydroxy: 17 ng/mL — ABNORMAL LOW (ref 30.0–100.0)

## 2022-02-08 ENCOUNTER — Telehealth: Payer: Self-pay

## 2022-02-08 ENCOUNTER — Other Ambulatory Visit: Payer: Self-pay

## 2022-02-08 MED ORDER — ERGOCALCIFEROL 1.25 MG (50000 UT) PO CAPS
50000.0000 [IU] | ORAL_CAPSULE | ORAL | 5 refills | Status: DC
Start: 2022-02-08 — End: 2022-05-04

## 2022-02-08 NOTE — Telephone Encounter (Signed)
-----   Message from Sallyanne Kuster, NP sent at 02/07/2022  6:37 AM EDT ----- ?Please call patient and let her know her results: ?--vitamin D level is significantly low, please send prescription for vitamin D 50,000 unit capsule, take 1 capsule by mouth weekly. Will repeat lab in 3-6 months.  ?--metabolic panel is grossly normal ?--cholesterol levels are abnormal with elevated total cholesterol of 208, and LDL of 129. HDL, VLDL and triglycerides are normal.  ?--CBC is normal ?--thyroid levels are normal.  ?

## 2022-02-09 ENCOUNTER — Ambulatory Visit
Admission: RE | Admit: 2022-02-09 | Discharge: 2022-02-09 | Disposition: A | Discharge status: Home / Self Care | Payer: 59 | Source: Ambulatory Visit | Attending: Nurse Practitioner | Admitting: Nurse Practitioner

## 2022-02-09 ENCOUNTER — Other Ambulatory Visit: Payer: Self-pay

## 2022-02-09 DIAGNOSIS — Z780 Asymptomatic menopausal state: Secondary | ICD-10-CM | POA: Insufficient documentation

## 2022-02-09 DIAGNOSIS — M810 Age-related osteoporosis without current pathological fracture: Secondary | ICD-10-CM | POA: Diagnosis not present

## 2022-02-09 DIAGNOSIS — M8589 Other specified disorders of bone density and structure, multiple sites: Secondary | ICD-10-CM | POA: Diagnosis not present

## 2022-02-10 NOTE — Progress Notes (Signed)
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 She should be taking a calcium supplement and vitamin D, we should also discuss adding medication for osteoporosis to reduce her risk of pathological fracture

## 2022-02-11 NOTE — Progress Notes (Signed)
LMOM for pt to call back and review results  ?

## 2022-02-14 ENCOUNTER — Telehealth: Payer: Self-pay

## 2022-02-14 NOTE — Telephone Encounter (Signed)
-----   Message from Jonetta Osgood, NP sent at 02/10/2022  5:38 AM EDT ----- ?Please call patient and let her know that her bone density came back with osteoporosis of her spine, and osteopenia of her left forearm, femur and femur neck. She should be taking a calcium supplement and vitamin D, we should also discuss adding medi ?cation for osteoporosis to reduce her risk of pathological fracture ?

## 2022-02-18 MED ORDER — ALENDRONATE SODIUM 70 MG PO TABS: 70.0000 mg | ORAL_TABLET | ORAL | 4 refills | Status: DC

## 2022-02-21 NOTE — Telephone Encounter (Signed)
LMOM that med was sent to pharmacy and she has enough refills for a year  ?

## 2022-02-27 ENCOUNTER — Telehealth: Payer: Self-pay

## 2022-02-27 NOTE — Telephone Encounter (Signed)
PA sent for ALENDRONATE SODIUM 70 mg 02/27/22 @ 603 pm ?

## 2022-03-01 ENCOUNTER — Telehealth: Payer: Self-pay

## 2022-03-01 NOTE — Telephone Encounter (Signed)
A prior authorization is not required for this patient's Alendronate Sodium 70mg  tablets. Asking pharmacy to process request for 30 days as covered by the plan on 03/01/2022. ?

## 2022-04-07 ENCOUNTER — Other Ambulatory Visit: Payer: Self-pay | Admitting: Nurse Practitioner

## 2022-04-07 DIAGNOSIS — F411 Generalized anxiety disorder: Secondary | ICD-10-CM

## 2022-04-08 NOTE — Telephone Encounter (Signed)
Med sent to pharmacy.

## 2022-05-04 ENCOUNTER — Ambulatory Visit: Payer: 59 | Admitting: Nurse Practitioner

## 2022-05-04 ENCOUNTER — Encounter: Payer: Self-pay | Admitting: Nurse Practitioner

## 2022-05-04 VITALS — BP 133/80 | HR 65 | Temp 98.3°F | Resp 16 | Ht 62.0 in | Wt 145.4 lb

## 2022-05-04 DIAGNOSIS — K5909 Other constipation: Secondary | ICD-10-CM | POA: Diagnosis not present

## 2022-05-04 DIAGNOSIS — I10 Essential (primary) hypertension: Secondary | ICD-10-CM

## 2022-05-04 DIAGNOSIS — Z658 Other specified problems related to psychosocial circumstances: Secondary | ICD-10-CM | POA: Diagnosis not present

## 2022-05-04 DIAGNOSIS — R69 Illness, unspecified: Secondary | ICD-10-CM | POA: Diagnosis not present

## 2022-05-04 DIAGNOSIS — E559 Vitamin D deficiency, unspecified: Secondary | ICD-10-CM

## 2022-05-04 DIAGNOSIS — F411 Generalized anxiety disorder: Secondary | ICD-10-CM

## 2022-05-04 DIAGNOSIS — F17210 Nicotine dependence, cigarettes, uncomplicated: Secondary | ICD-10-CM

## 2022-05-04 MED ORDER — CITALOPRAM HYDROBROMIDE 40 MG PO TABS: 20.0000 mg | ORAL_TABLET | Freq: Two times a day (BID) | ORAL | 1 refills | Status: DC

## 2022-05-04 MED ORDER — ALPRAZOLAM 0.5 MG PO TABS: 0.5000 mg | ORAL_TABLET | Freq: Two times a day (BID) | ORAL | 2 refills | Status: DC | PRN

## 2022-05-04 MED ORDER — AMLODIPINE BESYLATE 5 MG PO TABS: 5.0000 mg | ORAL_TABLET | Freq: Every day | ORAL | 1 refills | Status: DC

## 2022-05-04 MED ORDER — LUBIPROSTONE 24 MCG PO CAPS: 24.0000 ug | ORAL_CAPSULE | Freq: Two times a day (BID) | ORAL | 2 refills | Status: DC

## 2022-05-04 MED ORDER — ERGOCALCIFEROL 1.25 MG (50000 UT) PO CAPS: 50000.0000 [IU] | ORAL_CAPSULE | ORAL | 1 refills | Status: DC
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
## 2022-05-15 ENCOUNTER — Telehealth: Payer: Self-pay

## 2022-05-15 DIAGNOSIS — K5909 Other constipation: Secondary | ICD-10-CM

## 2022-05-15 MED ORDER — LUBIPROSTONE 24 MCG PO CAPS: 24.0000 ug | ORAL_CAPSULE | Freq: Two times a day (BID) | ORAL | 2 refills | Status: DC

## 2022-06-22 ENCOUNTER — Telehealth: Payer: Self-pay

## 2022-06-22 NOTE — Telephone Encounter (Signed)
Patient called requesting note to be excused from 07/05/22 jury duty. Sent message to [place] and [employer]

## 2022-08-01 ENCOUNTER — Ambulatory Visit (INDEPENDENT_AMBULATORY_CARE_PROVIDER_SITE_OTHER): Payer: 59 | Admitting: Nurse Practitioner

## 2022-08-01 ENCOUNTER — Encounter: Payer: Self-pay | Admitting: Nurse Practitioner

## 2022-08-01 VITALS — BP 140/82 | HR 66 | Temp 98.2°F | Resp 16 | Ht 62.0 in | Wt 147.0 lb

## 2022-08-01 DIAGNOSIS — I10 Essential (primary) hypertension: Secondary | ICD-10-CM

## 2022-08-01 DIAGNOSIS — J3089 Other allergic rhinitis: Secondary | ICD-10-CM

## 2022-08-01 DIAGNOSIS — Z760 Encounter for issue of repeat prescription: Secondary | ICD-10-CM

## 2022-08-01 DIAGNOSIS — Z79899 Other long term (current) drug therapy: Secondary | ICD-10-CM

## 2022-08-01 DIAGNOSIS — F411 Generalized anxiety disorder: Secondary | ICD-10-CM

## 2022-08-01 DIAGNOSIS — R69 Illness, unspecified: Secondary | ICD-10-CM | POA: Diagnosis not present

## 2022-08-01 DIAGNOSIS — K219 Gastro-esophageal reflux disease without esophagitis: Secondary | ICD-10-CM

## 2022-08-01 LAB — POCT URINE DRUG SCREEN
POC Amphetamine UR: NOT DETECTED
POC BENZODIAZEPINES UR: NOT DETECTED
POC Barbiturate UR: NOT DETECTED
POC Cocaine UR: NOT DETECTED
POC Ecstasy UR: NOT DETECTED
POC Marijuana UR: NOT DETECTED
POC Methadone UR: NOT DETECTED
POC Methamphetamine UR: NOT DETECTED
POC Opiate Ur: NOT DETECTED
POC Oxycodone UR: NOT DETECTED
POC PHENCYCLIDINE UR: NOT DETECTED
POC TRICYCLICS UR: NOT DETECTED

## 2022-08-01 MED ORDER — CITALOPRAM HYDROBROMIDE 40 MG PO TABS: 20.0000 mg | ORAL_TABLET | Freq: Two times a day (BID) | ORAL | 1 refills | Status: DC

## 2022-08-01 MED ORDER — CETIRIZINE HCL 10 MG PO TABS: 10.0000 mg | ORAL_TABLET | Freq: Every day | ORAL | Status: DC

## 2022-08-01 MED ORDER — FAMOTIDINE 20 MG PO TABS: 20.0000 mg | ORAL_TABLET | Freq: Two times a day (BID) | ORAL | 3 refills | Status: DC

## 2022-08-01 MED ORDER — AMLODIPINE BESYLATE 5 MG PO TABS: 5.0000 mg | ORAL_TABLET | Freq: Every day | ORAL | 1 refills | Status: DC

## 2022-08-01 MED ORDER — ALPRAZOLAM 0.5 MG PO TABS: 0.5000 mg | ORAL_TABLET | Freq: Two times a day (BID) | ORAL | 2 refills | Status: DC | PRN

## 2022-08-01 NOTE — Patient Instructions (Signed)
Start cetirizine 10 mg daily OTC  Also start an OTC nasal steroid spray -- generic fluticasone (Flonase) or generic triamcinolone (Nasocort). 1 spray in each nostril once or twice daily.

## 2022-08-01 NOTE — Progress Notes (Unsigned)
[facility] [address] [place], [place] [address]  Internal MEDICINE  Office Visit Note  Patient Name: Felicia Blackwell  [id number]  [id number]  Date of Service: 08/01/2022  Chief Complaint  Patient presents with   Follow-up   Depression   Gastroesophageal Reflux    Follow up med refills    HPI Wiletta presents for follow-up visit for depression, anxiety, medication refills, hypertension Still having significant anxiety which is worsened via interactions with family members She is Not sure if she will go back to help her mom, states her mom is running off her siblings.  --due for medication refills --due for UDS today --BP and other vital signs are stable.    Current Medication: Outpatient Encounter Medications as of 08/01/2022  Medication Sig   albuterol (VENTOLIN HFA) 108 (90 Base) MCG/ACT inhaler Inhale 2 puffs into the lungs every 6 (six) hours as needed for wheezing or shortness of breath.   alendronate (FOSAMAX) 70 MG tablet Take 1 tablet (70 mg total) by mouth once a week. Take with a full glass of water on an empty stomach.   ergocalciferol (VITAMIN D2) 1.25 MG (50000 UT) capsule Take 1 capsule (50,000 Units total) by mouth once a week.   ipratropium-albuterol (DUONEB) 0.5-2.5 (3) MG/3ML SOLN Take 3 mLs by nebulization every 6 (six) hours as needed.   Multiple Vitamins-Minerals (MULTIVITAMIN ADULT PO) Take by mouth daily.   [DISCONTINUED] ALPRAZolam (XANAX) 0.5 MG tablet Take 1 tablet (0.5 mg total) by mouth 2 (two) times daily as needed for anxiety.   [DISCONTINUED] amLODipine (NORVASC) 5 MG tablet Take 1 tablet (5 mg total) by mouth daily.   [DISCONTINUED] cetirizine (ZYRTEC) 10 MG tablet Take 1 tablet (10 mg total) by mouth daily.   [DISCONTINUED] citalopram (CELEXA) 40 MG tablet Take 0.5 tablets (20 mg total) by mouth 2 (two) times daily.   [DISCONTINUED] famotidine (PEPCID) 20 MG tablet Take 1 tablet (20 mg total) by mouth 2 (two) times daily.    [DISCONTINUED] hydrocortisone (ANUSOL-HC) 25 MG suppository Place 1 suppository (25 mg total) rectally 2 (two) times daily.   [DISCONTINUED] lubiprostone (AMITIZA) 24 MCG capsule Take 1 capsule (24 mcg total) by mouth 2 (two) times daily with a meal.   ALPRAZolam (XANAX) 0.5 MG tablet Take 1 tablet (0.5 mg total) by mouth 2 (two) times daily as needed for anxiety.   amLODipine (NORVASC) 5 MG tablet Take 1 tablet (5 mg total) by mouth daily.   citalopram (CELEXA) 40 MG tablet Take 0.5 tablets (20 mg total) by mouth 2 (two) times daily.   famotidine (PEPCID) 20 MG tablet Take 1 tablet (20 mg total) by mouth 2 (two) times daily.   No facility-administered encounter medications on file as of 08/01/2022.    Surgical History: Past Surgical History:  Procedure Laterality Date   TUBAL LIGATION      Medical History: Past Medical History:  Diagnosis Date   Anxiety    Depression    GERD (gastroesophageal reflux disease)    Osteoporosis     Family History: Family History  Problem Relation Age of Onset   Heart disease Mother    Hyperlipidemia Mother    Macular degeneration Mother    Glaucoma Mother    Hyperlipidemia Father    Aneurysm Father     Social History   Socioeconomic History   Marital status: Married    Spouse name: Not on file   Number of children: Not on file   Years of education: Not  on file   Highest education level: Not on file  Occupational History   Not on file  Tobacco Use   Smoking status: Every Day    Packs/day: 0.50    Types: Cigarettes   Smokeless tobacco: Never   Tobacco comments:    pt smoking 10 a day 02/25/21  Vaping Use   Vaping Use: Never used  Substance and Sexual Activity   Alcohol use: Not Currently   Drug use: Not Currently   Sexual activity: Not on file  Other Topics Concern   Not on file  Social History Narrative   Not on file   Social Determinants of Health   Financial Resource Strain: Not on file  Food Insecurity: Not on file   Transportation Needs: Not on file  Physical Activity: Not on file  Stress: Not on file  Social Connections: Not on file  Intimate Partner Violence: Not on file      Review of Systems  Constitutional:  Negative for chills, fatigue and unexpected weight change.  HENT:  Negative for congestion, rhinorrhea, sneezing and sore throat.   Eyes:  Negative for redness.  Respiratory: Negative.  Negative for cough, chest tightness, shortness of breath and wheezing.   Cardiovascular: Negative.  Negative for chest pain and palpitations.  Gastrointestinal:  Positive for constipation and nausea. Negative for abdominal pain, diarrhea and vomiting.  Genitourinary:  Negative for dysuria and frequency.  Musculoskeletal:  Negative for arthralgias, back pain, joint swelling and neck pain.  Skin:  Negative for rash.  Neurological: Negative.  Negative for tremors and numbness.  Hematological:  Negative for adenopathy. Does not bruise/bleed easily.  Psychiatric/Behavioral:  Positive for sleep disturbance. Negative for behavioral problems (Depression), self-injury and suicidal ideas. The patient is nervous/anxious.     Vital Signs: BP (!) 140/82 Comment: 156/78  Pulse 66   Temp 98.2 F (36.8 C)   Resp 16   Ht 5\' 2"  (1.575 m)   Wt 147 lb (66.7 kg)   SpO2 97%   BMI 26.89 kg/m    Physical Exam Vitals reviewed.  Constitutional:      General: She is not in acute distress.    Appearance: Normal appearance. She is not ill-appearing.  HENT:     Head: Normocephalic and atraumatic.  Eyes:     Pupils: Pupils are equal, round, and reactive to light.  Cardiovascular:     Rate and Rhythm: Normal rate and regular rhythm.  Pulmonary:     Effort: Pulmonary effort is normal. No respiratory distress.  Neurological:     Mental Status: She is alert and oriented to person, place, and time.  Psychiatric:        Mood and Affect: Mood normal.        Behavior: Behavior normal.        Assessment/Plan: 1.  Primary hypertension Stable, continue amlodipine as prescribed.  - amLODipine (NORVASC) 5 MG tablet; Take 1 tablet (5 mg total) by mouth daily.  Dispense: 90 tablet; Refill: 1  2. Non-seasonal allergic rhinitis due to other allergic trigger Takes cetirizine for allergic rhinitis, continue OTC as desired - cetirizine (ZYRTEC) 10 MG tablet; Take 1 tablet (10 mg total) by mouth daily.  3. Gastroesophageal reflux disease without esophagitis Stable, continue famotidine as prescribed.  - famotidine (PEPCID) 20 MG tablet; Take 1 tablet (20 mg total) by mouth 2 (two) times daily.  Dispense: 60 tablet; Refill: 3  4. Generalized anxiety disorder Refills of medications ordered. Anxiety level is elevated but manageable with  current medications, continue as prescribed - ALPRAZolam (XANAX) 0.5 MG tablet; Take 1 tablet (0.5 mg total) by mouth 2 (two) times daily as needed for anxiety.  Dispense: 60 tablet; Refill: 2 - citalopram (CELEXA) 40 MG tablet; Take 0.5 tablets (20 mg total) by mouth 2 (two) times daily.  Dispense: 90 tablet; Refill: 1  5. Encounter for long-term (current) use of medications UDS negative today but patient does not take her alprazolam every day, mostly, she takes the alprazolam when she is staying with her mother. - POCT Urine Drug Screen   General Counseling: Tarena verbalizes understanding of the findings of todays visit and agrees with plan of treatment. I have discussed any further diagnostic evaluation that may be needed or ordered today. We also reviewed her medications today. she has been encouraged to call the office with any questions or concerns that should arise related to todays visit.    Orders Placed This Encounter  Procedures   POCT Urine Drug Screen    Meds ordered this encounter  Medications   ALPRAZolam (XANAX) 0.5 MG tablet    Sig: Take 1 tablet (0.5 mg total) by mouth 2 (two) times daily as needed for anxiety.    Dispense:  60 tablet    Refill:  2     For future refills. This medication is medically necessary for the patient to have, please do not let her run out.   amLODipine (NORVASC) 5 MG tablet    Sig: Take 1 tablet (5 mg total) by mouth daily.    Dispense:  90 tablet    Refill:  1   citalopram (CELEXA) 40 MG tablet    Sig: Take 0.5 tablets (20 mg total) by mouth 2 (two) times daily.    Dispense:  90 tablet    Refill:  1   famotidine (PEPCID) 20 MG tablet    Sig: Take 1 tablet (20 mg total) by mouth 2 (two) times daily.    Dispense:  60 tablet    Refill:  3   DISCONTD: cetirizine (ZYRTEC) 10 MG tablet    Sig: Take 1 tablet (10 mg total) by mouth daily.    Return in about 3 months (around 10/31/2022) for F/U, anxiety med refill, Eulala Newcombe PCP.   Total time spent:30 Minutes Time spent includes review of chart, medications, test results, and follow up plan with the patient.   [place] Controlled Substance Database was reviewed by me.  This patient was seen by Sallyanne Kuster, FNP-C in collaboration with Dr. Beverely Risen as a part of collaborative care agreement.   Christinia Lambeth R. Tedd Sias, MSN, FNP-C Internal medicine

## 2022-08-08 ENCOUNTER — Telehealth: Payer: Self-pay

## 2022-08-08 ENCOUNTER — Other Ambulatory Visit: Payer: Self-pay

## 2022-08-08 MED ORDER — AZITHROMYCIN 250 MG PO TABS: ORAL_TABLET | ORAL | 0 refills | Status: DC

## 2022-08-08 NOTE — Telephone Encounter (Signed)
As per Felicia Blackwell pt advised that we send ZPAK

## 2022-08-11 DIAGNOSIS — H02135 Senile ectropion of left lower eyelid: Secondary | ICD-10-CM | POA: Diagnosis not present

## 2022-08-11 DIAGNOSIS — H02042 Spastic entropion of right lower eyelid: Secondary | ICD-10-CM | POA: Diagnosis not present

## 2022-08-19 ENCOUNTER — Telehealth: Payer: Self-pay | Admitting: Nurse Practitioner

## 2022-08-19 ENCOUNTER — Other Ambulatory Visit: Payer: Self-pay | Admitting: Nurse Practitioner

## 2022-08-19 DIAGNOSIS — R062 Wheezing: Secondary | ICD-10-CM

## 2022-08-19 DIAGNOSIS — R051 Acute cough: Secondary | ICD-10-CM

## 2022-08-19 NOTE — Telephone Encounter (Signed)
Lvm to schedule appointment per [facility]

## 2022-08-24 ENCOUNTER — Ambulatory Visit (INDEPENDENT_AMBULATORY_CARE_PROVIDER_SITE_OTHER): Payer: 59 | Admitting: Nurse Practitioner

## 2022-08-24 ENCOUNTER — Encounter: Payer: Self-pay | Admitting: Nurse Practitioner

## 2022-08-24 VITALS — BP 137/77 | HR 65 | Temp 97.0°F | Resp 16 | Ht 62.0 in | Wt 149.0 lb

## 2022-08-24 DIAGNOSIS — J3089 Other allergic rhinitis: Secondary | ICD-10-CM

## 2022-08-24 DIAGNOSIS — B9689 Other specified bacterial agents as the cause of diseases classified elsewhere: Secondary | ICD-10-CM | POA: Diagnosis not present

## 2022-08-24 DIAGNOSIS — J019 Acute sinusitis, unspecified: Secondary | ICD-10-CM

## 2022-08-24 DIAGNOSIS — F411 Generalized anxiety disorder: Secondary | ICD-10-CM

## 2022-08-24 DIAGNOSIS — R69 Illness, unspecified: Secondary | ICD-10-CM | POA: Diagnosis not present

## 2022-08-24 MED ORDER — DOXYCYCLINE HYCLATE 100 MG PO TABS: 100.0000 mg | ORAL_TABLET | Freq: Two times a day (BID) | ORAL | 0 refills | Status: AC

## 2022-08-24 MED ORDER — LEVOCETIRIZINE DIHYDROCHLORIDE 5 MG PO TABS: 5.0000 mg | ORAL_TABLET | Freq: Every evening | ORAL | 2 refills | Status: AC

## 2022-08-24 NOTE — Progress Notes (Signed)
[facility] [place], [place] [address]  Internal MEDICINE  Office Visit Note  Patient Name: Felicia Blackwell  [id number]  [id number]  Date of Service: 08/24/2022  Chief Complaint  Patient presents with   Acute Visit    Zpack completed, still has drainage.      HPI Zariyah presents for an acute sick visit for continued sinus drainage.  Zpack helped some but possibly allergies. Drainage is no longer green and is now clear.  Surgery tomorrow on lower eyelids -- needs to take her meds in the AM with only a sip of water, esp amlodipine.  Increased anxiety about surgery and risk of infection after surgery.  Confirms postnasal drip and worried her drainage will go into her lungs and drown her. Takes cetirizine but has not tried any other antihistamines.   Current Medication:  Outpatient Encounter Medications as of 08/24/2022  Medication Sig   albuterol (VENTOLIN HFA) 108 (90 Base) MCG/ACT inhaler Inhale 2 puffs into the lungs every 6 (six) hours as needed for wheezing or shortness of breath.   alendronate (FOSAMAX) 70 MG tablet Take 1 tablet (70 mg total) by mouth once a week. Take with a full glass of water on an empty stomach.   ALPRAZolam (XANAX) 0.5 MG tablet Take 1 tablet (0.5 mg total) by mouth 2 (two) times daily as needed for anxiety.   amLODipine (NORVASC) 5 MG tablet Take 1 tablet (5 mg total) by mouth daily.   citalopram (CELEXA) 40 MG tablet Take 0.5 tablets (20 mg total) by mouth 2 (two) times daily.   doxycycline (VIBRA-TABS) 100 MG tablet Take 1 tablet (100 mg total) by mouth 2 (two) times daily for 10 days.   ergocalciferol (VITAMIN D2) 1.25 MG (50000 UT) capsule Take 1 capsule (50,000 Units total) by mouth once a week.   famotidine (PEPCID) 20 MG tablet Take 1 tablet (20 mg total) by mouth 2 (two) times daily.   ipratropium-albuterol (DUONEB) 0.5-2.5 (3) MG/3ML SOLN Take 3 mLs by nebulization every 6 (six) hours as needed.   levocetirizine (XYZAL)  5 MG tablet Take 1 tablet (5 mg total) by mouth every evening.   Multiple Vitamins-Minerals (MULTIVITAMIN ADULT PO) Take by mouth daily.   [DISCONTINUED] azithromycin (ZITHROMAX) 250 MG tablet Use as directed for 5 days   [DISCONTINUED] cetirizine (ZYRTEC) 10 MG tablet Take 1 tablet (10 mg total) by mouth daily.   No facility-administered encounter medications on file as of 08/24/2022.      Medical History: Past Medical History:  Diagnosis Date   Anxiety    Depression    GERD (gastroesophageal reflux disease)    Osteoporosis      Vital Signs: BP 137/77   Pulse 65   Temp (!) 97 F (36.1 C)   Resp 16   Ht 5\' 2"  (1.575 m)   Wt 149 lb (67.6 kg)   SpO2 97%   BMI 27.25 kg/m    Review of Systems  Constitutional:  Negative for chills, fatigue and unexpected weight change.  HENT:  Positive for congestion, ear pain (left), postnasal drip, rhinorrhea, sinus pressure and sore throat. Negative for sneezing.   Eyes:  Positive for discharge, redness and itching.  Respiratory:  Positive for cough. Negative for chest tightness, shortness of breath and wheezing.   Cardiovascular: Negative.  Negative for chest pain and palpitations.  Gastrointestinal:  Positive for constipation and nausea. Negative for abdominal pain, diarrhea and vomiting.  Genitourinary:  Negative for dysuria and frequency.  Musculoskeletal:  Negative for arthralgias, back pain, joint swelling and neck pain.  Neurological: Negative.  Negative for tremors and numbness.  Hematological:  Negative for adenopathy. Does not bruise/bleed easily.  Psychiatric/Behavioral:  Positive for sleep disturbance. Negative for behavioral problems (Depression), self-injury and suicidal ideas. The patient is nervous/anxious.     Physical Exam Vitals reviewed.  Constitutional:      General: She is not in acute distress.    Appearance: Normal appearance. She is not ill-appearing.  HENT:     Head: Normocephalic and atraumatic.     Right  Ear: Tympanic membrane, ear canal and external ear normal.     Left Ear: Tympanic membrane and external ear normal. Swelling present.     Nose: Mucosal edema, congestion and rhinorrhea present.     Mouth/Throat:     Lips: Pink.     Pharynx: Uvula midline. Posterior oropharyngeal erythema present.  Eyes:     General: Lids are normal. Vision grossly intact. Gaze aligned appropriately.        Right eye: Discharge present.        Left eye: Discharge present.    Conjunctiva/sclera:     Right eye: Right conjunctiva is injected.     Left eye: Left conjunctiva is injected.     Pupils: Pupils are equal, round, and reactive to light.  Cardiovascular:     Rate and Rhythm: Normal rate and regular rhythm.     Heart sounds: Normal heart sounds. No murmur heard. Pulmonary:     Effort: Pulmonary effort is normal. No respiratory distress.     Breath sounds: Normal breath sounds. No wheezing.  Lymphadenopathy:     Cervical: Cervical adenopathy present.  Neurological:     Mental Status: She is alert.  Psychiatric:        Mood and Affect: Mood normal.        Behavior: Behavior normal.       Assessment/Plan: 1. Acute bacterial sinusitis 2nd course of antibiotics prescribed to help clear up any residual infection, doxycycline will also help prevent any post-surgical infection from developing -regarding her surgery tomorrow. - doxycycline (VIBRA-TABS) 100 MG tablet; Take 1 tablet (100 mg total) by mouth 2 (two) times daily for 10 days.  Dispense: 20 tablet; Refill: 0  2. Non-seasonal allergic rhinitis due to other allergic trigger Cetirizine discontinued. Start levocetirizine tonight - levocetirizine (XYZAL) 5 MG tablet; Take 1 tablet (5 mg total) by mouth every evening.  Dispense: 30 tablet; Refill: 2  3. Generalized anxiety disorder Instructed patient to take 0.5 mg alprazolam in the morning tomorrow with her morning medications and a small sip of water prior to her surgery tomorrow due to  increased anxiety regarding procedure   General Counseling: Meyer Russel understanding of the findings of todays visit and agrees with plan of treatment. I have discussed any further diagnostic evaluation that may be needed or ordered today. We also reviewed her medications today. she has been encouraged to call the office with any questions or concerns that should arise related to todays visit.    Counseling:    No orders of the defined types were placed in this encounter.   Meds ordered this encounter  Medications   levocetirizine (XYZAL) 5 MG tablet    Sig: Take 1 tablet (5 mg total) by mouth every evening.    Dispense:  30 tablet    Refill:  2    Discontinue cetirizine, fill new script today.   doxycycline (VIBRA-TABS) 100 MG tablet  Sig: Take 1 tablet (100 mg total) by mouth 2 (two) times daily for 10 days.    Dispense:  20 tablet    Refill:  0    Return for upcoming follow up visit soon, previously scheduled.  [place] Controlled Substance Database was reviewed by me for overdose risk score (ORS)  Time spent:30 Minutes Time spent with patient included reviewing progress notes, labs, imaging studies, and discussing plan for follow up.   This patient was seen by Sallyanne Kuster, FNP-C in collaboration with Dr. Beverely Risen as a part of collaborative care agreement.  Azim Gillingham R. Tedd Sias, MSN, FNP-C Internal Medicine

## 2022-09-21 ENCOUNTER — Encounter: Payer: 59 | Admitting: Nurse Practitioner

## 2022-09-25 ENCOUNTER — Encounter: Payer: Self-pay | Admitting: Nurse Practitioner

## 2022-09-30 ENCOUNTER — Telehealth: Payer: Self-pay | Admitting: Nurse Practitioner

## 2022-09-30 NOTE — Telephone Encounter (Signed)
Left vm and sent mychart message to confirm 10/06/22 appointment-Toni 

## 2022-10-06 ENCOUNTER — Ambulatory Visit (INDEPENDENT_AMBULATORY_CARE_PROVIDER_SITE_OTHER): Payer: 59 | Admitting: Nurse Practitioner

## 2022-10-06 ENCOUNTER — Encounter: Payer: Self-pay | Admitting: Nurse Practitioner

## 2022-10-06 VITALS — BP 138/76 | HR 73 | Temp 97.5°F | Resp 16 | Ht 62.0 in | Wt 149.6 lb

## 2022-10-06 DIAGNOSIS — Z1231 Encounter for screening mammogram for malignant neoplasm of breast: Secondary | ICD-10-CM

## 2022-10-06 DIAGNOSIS — R300 Dysuria: Secondary | ICD-10-CM | POA: Diagnosis not present

## 2022-10-06 DIAGNOSIS — E782 Mixed hyperlipidemia: Secondary | ICD-10-CM

## 2022-10-06 DIAGNOSIS — F411 Generalized anxiety disorder: Secondary | ICD-10-CM

## 2022-10-06 DIAGNOSIS — Z122 Encounter for screening for malignant neoplasm of respiratory organs: Secondary | ICD-10-CM

## 2022-10-06 DIAGNOSIS — Z760 Encounter for issue of repeat prescription: Secondary | ICD-10-CM

## 2022-10-06 DIAGNOSIS — Z0001 Encounter for general adult medical examination with abnormal findings: Secondary | ICD-10-CM | POA: Diagnosis not present

## 2022-10-06 DIAGNOSIS — F17210 Nicotine dependence, cigarettes, uncomplicated: Secondary | ICD-10-CM

## 2022-10-06 MED ORDER — ALPRAZOLAM 0.5 MG PO TABS: 0.5000 mg | ORAL_TABLET | Freq: Two times a day (BID) | ORAL | 2 refills | Status: DC | PRN
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
## 2022-10-07 LAB — UA/M W/RFLX CULTURE, ROUTINE
Bilirubin, UA: NEGATIVE
Glucose, UA: NEGATIVE
Ketones, UA: NEGATIVE
Leukocytes,UA: NEGATIVE
Nitrite, UA: NEGATIVE
Protein,UA: NEGATIVE
RBC, UA: NEGATIVE
Specific Gravity, UA: <=1.005 — AB (ref 1.005–1.030)
Urobilinogen, Ur: 0.2 mg/dL (ref 0.2–1.0)
pH, UA: 5.5 (ref 5.0–7.5)

## 2022-10-07 LAB — MICROSCOPIC EXAMINATION
Bacteria, UA: NONE SEEN
Casts: NONE SEEN /lpf
Epithelial Cells (non renal): NONE SEEN /hpf (ref 0–10)
RBC, Urine: NONE SEEN /hpf (ref 0–2)
WBC, UA: NONE SEEN /hpf (ref 0–5)

## 2022-10-27 ENCOUNTER — Ambulatory Visit: Payer: 59 | Admitting: Nurse Practitioner

## 2022-10-27 ENCOUNTER — Ambulatory Visit
Admission: RE | Admit: 2022-10-27 | Discharge: 2022-10-27 | Disposition: A | Discharge status: Home / Self Care | Payer: 59 | Source: Ambulatory Visit | Attending: Nurse Practitioner | Admitting: Nurse Practitioner

## 2022-10-27 DIAGNOSIS — Z1231 Encounter for screening mammogram for malignant neoplasm of breast: Secondary | ICD-10-CM | POA: Insufficient documentation

## 2022-10-31 ENCOUNTER — Inpatient Hospital Stay
Admission: RE | Admit: 2022-10-31 | Discharge: 2022-10-31 | Disposition: A | Discharge status: Home / Self Care | Payer: Self-pay | Source: Ambulatory Visit | Attending: *Deleted | Admitting: *Deleted

## 2022-10-31 ENCOUNTER — Other Ambulatory Visit: Payer: Self-pay | Admitting: *Deleted

## 2022-10-31 DIAGNOSIS — Z1231 Encounter for screening mammogram for malignant neoplasm of breast: Secondary | ICD-10-CM

## 2022-11-21 ENCOUNTER — Encounter: Payer: Self-pay | Admitting: Nurse Practitioner

## 2022-11-23 ENCOUNTER — Telehealth: Payer: Self-pay | Admitting: Nurse Practitioner

## 2022-11-23 NOTE — Telephone Encounter (Signed)
Lvm for Denise with [facility] regarding referral-Toni 

## 2022-12-01 ENCOUNTER — Other Ambulatory Visit: Payer: Self-pay

## 2022-12-01 DIAGNOSIS — F17210 Nicotine dependence, cigarettes, uncomplicated: Secondary | ICD-10-CM

## 2022-12-01 DIAGNOSIS — Z122 Encounter for screening for malignant neoplasm of respiratory organs: Secondary | ICD-10-CM

## 2022-12-01 DIAGNOSIS — Z87891 Personal history of nicotine dependence: Secondary | ICD-10-CM

## 2022-12-12 DIAGNOSIS — H02832 Dermatochalasis of right lower eyelid: Secondary | ICD-10-CM | POA: Diagnosis not present

## 2022-12-12 DIAGNOSIS — Z9889 Other specified postprocedural states: Secondary | ICD-10-CM | POA: Diagnosis not present

## 2022-12-12 DIAGNOSIS — H02835 Dermatochalasis of left lower eyelid: Secondary | ICD-10-CM | POA: Diagnosis not present

## 2022-12-12 DIAGNOSIS — H02834 Dermatochalasis of left upper eyelid: Secondary | ICD-10-CM | POA: Diagnosis not present

## 2022-12-12 DIAGNOSIS — H02831 Dermatochalasis of right upper eyelid: Secondary | ICD-10-CM | POA: Diagnosis not present

## 2023-01-03 ENCOUNTER — Encounter: Payer: Self-pay | Admitting: Nurse Practitioner

## 2023-01-03 ENCOUNTER — Ambulatory Visit (INDEPENDENT_AMBULATORY_CARE_PROVIDER_SITE_OTHER): Payer: 59 | Admitting: Nurse Practitioner

## 2023-01-03 VITALS — BP 140/84 | HR 73 | Temp 97.9°F | Resp 16 | Ht 62.0 in | Wt 150.8 lb

## 2023-01-03 DIAGNOSIS — E559 Vitamin D deficiency, unspecified: Secondary | ICD-10-CM | POA: Diagnosis not present

## 2023-01-03 DIAGNOSIS — R69 Illness, unspecified: Secondary | ICD-10-CM | POA: Diagnosis not present

## 2023-01-03 DIAGNOSIS — Z760 Encounter for issue of repeat prescription: Secondary | ICD-10-CM

## 2023-01-03 DIAGNOSIS — M810 Age-related osteoporosis without current pathological fracture: Secondary | ICD-10-CM

## 2023-01-03 DIAGNOSIS — E782 Mixed hyperlipidemia: Secondary | ICD-10-CM | POA: Diagnosis not present

## 2023-01-03 DIAGNOSIS — Z0000 Encounter for general adult medical examination without abnormal findings: Secondary | ICD-10-CM

## 2023-01-03 DIAGNOSIS — F411 Generalized anxiety disorder: Secondary | ICD-10-CM

## 2023-01-03 DIAGNOSIS — I10 Essential (primary) hypertension: Secondary | ICD-10-CM | POA: Diagnosis not present

## 2023-01-03 MED ORDER — AMLODIPINE BESYLATE 5 MG PO TABS: 5.0000 mg | ORAL_TABLET | Freq: Every day | ORAL | 1 refills | Status: DC

## 2023-01-03 MED ORDER — CITALOPRAM HYDROBROMIDE 40 MG PO TABS: 20.0000 mg | ORAL_TABLET | Freq: Two times a day (BID) | ORAL | 1 refills | Status: DC

## 2023-01-03 MED ORDER — ALPRAZOLAM 0.5 MG PO TABS: 0.5000 mg | ORAL_TABLET | Freq: Two times a day (BID) | ORAL | 2 refills | Status: DC | PRN

## 2023-01-03 MED ORDER — ERGOCALCIFEROL 1.25 MG (50000 UT) PO CAPS: 50000.0000 [IU] | ORAL_CAPSULE | ORAL | 1 refills | Status: DC

## 2023-01-03 MED ORDER — ALENDRONATE SODIUM 70 MG PO TABS: 70.0000 mg | ORAL_TABLET | ORAL | 4 refills | Status: DC

## 2023-01-03 NOTE — Progress Notes (Signed)
[facility], [place] [address]  Internal MEDICINE  Office Visit Note  Patient Name: Felicia Blackwell  [id number]  [id number]  Date of Service: 01/03/2023  Chief Complaint  Patient presents with   Follow-up   Depression   Gastroesophageal Reflux    HPI Felicia Blackwell presents for a follow-up visit for depression, anxiety, and medication refills.  Feels like she may have PTSD, Gets panicky when leaving the house Needs refills alprazolam Due for labs in march -- labs were deferred at her [facility] in November until march this year.  Feels like the medication is still helping.  BP is controlled with current medication.     Current Medication: Outpatient Encounter Medications as of 01/03/2023  Medication Sig   albuterol (VENTOLIN HFA) 108 (90 Base) MCG/ACT inhaler Inhale 2 puffs into the lungs every 6 (six) hours as needed for wheezing or shortness of breath.   famotidine (PEPCID) 20 MG tablet Take 1 tablet (20 mg total) by mouth 2 (two) times daily.   ipratropium-albuterol (DUONEB) 0.5-2.5 (3) MG/3ML SOLN Take 3 mLs by nebulization every 6 (six) hours as needed.   levocetirizine (XYZAL) 5 MG tablet Take 1 tablet (5 mg total) by mouth every evening.   Multiple Vitamins-Minerals (MULTIVITAMIN ADULT PO) Take by mouth daily.   [DISCONTINUED] alendronate (FOSAMAX) 70 MG tablet Take 1 tablet (70 mg total) by mouth once a week. Take with a full glass of water on an empty stomach.   [DISCONTINUED] ALPRAZolam (XANAX) 0.5 MG tablet Take 1 tablet (0.5 mg total) by mouth 2 (two) times daily as needed for anxiety.   [DISCONTINUED] amLODipine (NORVASC) 5 MG tablet Take 1 tablet (5 mg total) by mouth daily.   [DISCONTINUED] citalopram (CELEXA) 40 MG tablet Take 0.5 tablets (20 mg total) by mouth 2 (two) times daily.   [DISCONTINUED] ergocalciferol (VITAMIN D2) 1.25 MG (50000 UT) capsule Take 1 capsule (50,000 Units total) by mouth once a week.   alendronate (FOSAMAX) 70 MG  tablet Take 1 tablet (70 mg total) by mouth once a week. Take with a full glass of water on an empty stomach.   ALPRAZolam (XANAX) 0.5 MG tablet Take 1 tablet (0.5 mg total) by mouth 2 (two) times daily as needed for anxiety.   amLODipine (NORVASC) 5 MG tablet Take 1 tablet (5 mg total) by mouth daily.   citalopram (CELEXA) 40 MG tablet Take 0.5 tablets (20 mg total) by mouth 2 (two) times daily.   ergocalciferol (VITAMIN D2) 1.25 MG (50000 UT) capsule Take 1 capsule (50,000 Units total) by mouth once a week.   No facility-administered encounter medications on file as of 01/03/2023.    Surgical History: Past Surgical History:  Procedure Laterality Date   TUBAL LIGATION      Medical History: Past Medical History:  Diagnosis Date   Anxiety    Depression    GERD (gastroesophageal reflux disease)    Osteoporosis     Family History: Family History  Problem Relation Age of Onset   Heart disease Mother    Hyperlipidemia Mother    Macular degeneration Mother    Glaucoma Mother    Hyperlipidemia Father    Aneurysm Father     Social History   Socioeconomic History   Marital status: Married    Spouse name: Not on file   Number of children: Not on file   Years of education: Not on file   Highest education level: Not on file  Occupational History  Not on file  Tobacco Use   Smoking status: Every Day    Packs/day: 0.50    Types: Cigarettes   Smokeless tobacco: Never   Tobacco comments:    pt smoking 10 a day 02/25/21  Vaping Use   Vaping Use: Never used  Substance and Sexual Activity   Alcohol use: Not Currently   Drug use: Not Currently   Sexual activity: Not on file  Other Topics Concern   Not on file  Social History Narrative   Not on file   Social Determinants of Health   Financial Resource Strain: Not on file  Food Insecurity: Not on file  Transportation Needs: Not on file  Physical Activity: Not on file  Stress: Not on file  Social Connections: Not on file   Intimate Partner Violence: Not on file      Review of Systems  Constitutional:  Negative for chills, fatigue and unexpected weight change.  HENT:  Negative for congestion, rhinorrhea, sneezing and sore throat.   Eyes:  Negative for redness.  Respiratory: Negative.  Negative for cough, chest tightness, shortness of breath and wheezing.   Cardiovascular: Negative.  Negative for chest pain and palpitations.  Gastrointestinal:  Positive for constipation and nausea. Negative for abdominal pain, diarrhea and vomiting.  Genitourinary:  Negative for dysuria and frequency.  Musculoskeletal:  Negative for arthralgias, back pain, joint swelling and neck pain.  Skin:  Negative for rash.  Neurological: Negative.  Negative for tremors and numbness.  Hematological:  Negative for adenopathy. Does not bruise/bleed easily.  Psychiatric/Behavioral:  Positive for sleep disturbance. Negative for behavioral problems (Depression), self-injury and suicidal ideas. The patient is nervous/anxious.     Vital Signs: BP (!) 140/84   Pulse 73   Temp 97.9 F (36.6 C)   Resp 16   Ht 5' 2"$  (1.575 m)   Wt 150 lb 12.8 oz (68.4 kg)   SpO2 95%   BMI 27.58 kg/m    Physical Exam Vitals reviewed.  Constitutional:      General: She is not in acute distress.    Appearance: Normal appearance. She is not ill-appearing.  HENT:     Head: Normocephalic and atraumatic.  Eyes:     Pupils: Pupils are equal, round, and reactive to light.  Cardiovascular:     Rate and Rhythm: Normal rate and regular rhythm.  Pulmonary:     Effort: Pulmonary effort is normal. No respiratory distress.  Neurological:     Mental Status: She is alert and oriented to person, place, and time.  Psychiatric:        Mood and Affect: Mood normal.        Behavior: Behavior normal.        Assessment/Plan: 1. Essential hypertension Stable, continue amlodipine as prescribed.  - amLODipine (NORVASC) 5 MG tablet; Take 1 tablet (5 mg total)  by mouth daily.  Dispense: 90 tablet; Refill: 1  2. Age-related osteoporosis without current pathological fracture Continue alendronate as prescribed. - alendronate (FOSAMAX) 70 MG tablet; Take 1 tablet (70 mg total) by mouth once a week. Take with a full glass of water on an empty stomach.  Dispense: 12 tablet; Refill: 4  3. Vitamin D deficiency Continue vitamin D supplement and routine lab ordered. - ergocalciferol (VITAMIN D2) 1.25 MG (50000 UT) capsule; Take 1 capsule (50,000 Units total) by mouth once a week.  Dispense: 12 capsule; Refill: 1 - Vitamin D (25 hydroxy)  4. Mixed hyperlipidemia Labs ordered to repeat for routine monitoring.  -  Vitamin D (25 hydroxy) - CBC with Differential/Platelet - CMP14+EGFR - TSH + free T4 - Lipid Profile  5. Routine health maintenance Due for repeat labs  - Vitamin D (25 hydroxy) - CBC with Differential/Platelet - CMP14+EGFR - TSH + free T4 - Lipid Profile  6. Generalized anxiety disorder Continue citalopram and prn alprazolam as prescribed.  - ALPRAZolam (XANAX) 0.5 MG tablet; Take 1 tablet (0.5 mg total) by mouth 2 (two) times daily as needed for anxiety.  Dispense: 60 tablet; Refill: 2 - citalopram (CELEXA) 40 MG tablet; Take 0.5 tablets (20 mg total) by mouth 2 (two) times daily.  Dispense: 90 tablet; Refill: 1   General Counseling: Frances verbalizes understanding of the findings of todays visit and agrees with plan of treatment. I have discussed any further diagnostic evaluation that may be needed or ordered today. We also reviewed her medications today. she has been encouraged to call the office with any questions or concerns that should arise related to todays visit.    Orders Placed This Encounter  Procedures   Vitamin D (25 hydroxy)   CBC with Differential/Platelet   CMP14+EGFR   TSH + free T4   Lipid Profile    Meds ordered this encounter  Medications   ALPRAZolam (XANAX) 0.5 MG tablet    Sig: Take 1 tablet (0.5 mg  total) by mouth 2 (two) times daily as needed for anxiety.    Dispense:  60 tablet    Refill:  2    For future refills. This medication is medically necessary for the patient to have, please do not let her run out.   amLODipine (NORVASC) 5 MG tablet    Sig: Take 1 tablet (5 mg total) by mouth daily.    Dispense:  90 tablet    Refill:  1   alendronate (FOSAMAX) 70 MG tablet    Sig: Take 1 tablet (70 mg total) by mouth once a week. Take with a full glass of water on an empty stomach.    Dispense:  12 tablet    Refill:  4   citalopram (CELEXA) 40 MG tablet    Sig: Take 0.5 tablets (20 mg total) by mouth 2 (two) times daily.    Dispense:  90 tablet    Refill:  1   ergocalciferol (VITAMIN D2) 1.25 MG (50000 UT) capsule    Sig: Take 1 capsule (50,000 Units total) by mouth once a week.    Dispense:  12 capsule    Refill:  1    Return in about 3 months (around 03/27/2023) for F/U, anxiety med refill, Sabastien Tyler PCP.   Total time spent:30 Minutes Time spent includes review of chart, medications, test results, and follow up plan with the patient.   [place] Controlled Substance Database was reviewed by me.  This patient was seen by Jonetta Osgood, FNP-C in collaboration with Dr. Clayborn Bigness as a part of collaborative care agreement.   Denarius Sesler R. Valetta Fuller, MSN, FNP-C Internal medicine

## 2023-01-08 ENCOUNTER — Encounter: Payer: Self-pay | Admitting: Nurse Practitioner

## 2023-01-10 ENCOUNTER — Ambulatory Visit (INDEPENDENT_AMBULATORY_CARE_PROVIDER_SITE_OTHER): Payer: 59 | Admitting: Acute Care

## 2023-01-10 ENCOUNTER — Encounter: Payer: Self-pay | Admitting: Acute Care

## 2023-01-10 DIAGNOSIS — F17210 Nicotine dependence, cigarettes, uncomplicated: Secondary | ICD-10-CM

## 2023-01-10 DIAGNOSIS — R69 Illness, unspecified: Secondary | ICD-10-CM | POA: Diagnosis not present

## 2023-01-10 NOTE — Patient Instructions (Signed)
Thank you for participating in the  Lung Cancer Screening Program. It was our pleasure to meet you today. We will call you with the results of your scan within the next few days. Your scan will be assigned a Lung RADS category score by the physicians reading the scans.  This Lung RADS score determines follow up scanning.  See below for description of categories, and follow up screening recommendations. We will be in touch to schedule your follow up screening annually or based on recommendations of our providers. We will fax a copy of your scan results to your Primary Care Physician, or the physician who referred you to the program, to ensure they have the results. Please call the office if you have any questions or concerns regarding your scanning experience or results.  Our office number is [phone]. Please speak with Denise Phelps, RN. , or  Denise Buckner RN, They are  our Lung Cancer Screening RN.'s If They are unavailable when you call, Please leave a message on the voice mail. We will return your call at our earliest convenience.This voice mail is monitored several times a day.  Remember, if your scan is normal, we will scan you annually as long as you continue to meet the criteria for the program. (Age 50-80, Current smoker or smoker who has quit within the last 15 years). If you are a smoker, remember, quitting is the single most powerful action that you can take to decrease your risk of lung cancer and other pulmonary, breathing related problems. We know quitting is hard, and we are here to help.  Please let us know if there is anything we can do to help you meet your goal of quitting. If you are a former smoker, congratulations. We are proud of you! Remain smoke free! Remember you can refer friends or family members through the number above.  We will screen them to make sure they meet criteria for the program. Thank you for helping us take better care of you by  participating in Lung Screening.  You can receive free nicotine replacement therapy ( patches, gum or mints) by calling 1-800-QUIT NOW. Please call so we can get you on the path to becoming  a non-smoker. I know it is hard, but you can do this!  Lung RADS Categories:  Lung RADS 1: no nodules or definitely non-concerning nodules.  Recommendation is for a repeat annual scan in 12 months.  Lung RADS 2:  nodules that are non-concerning in appearance and behavior with a very low likelihood of becoming an active cancer. Recommendation is for a repeat annual scan in 12 months.  Lung RADS 3: nodules that are probably non-concerning , includes nodules with a low likelihood of becoming an active cancer.  Recommendation is for a 6-month repeat screening scan. Often noted after an upper respiratory illness. We will be in touch to make sure you have no questions, and to schedule your 6-month scan.  Lung RADS 4 A: nodules with concerning findings, recommendation is most often for a follow up scan in 3 months or additional testing based on our provider's assessment of the scan. We will be in touch to make sure you have no questions and to schedule the recommended 3 month follow up scan.  Lung RADS 4 B:  indicates findings that are concerning. We will be in touch with you to schedule additional diagnostic testing based on our provider's  assessment of the scan.  Other options for assistance in smoking cessation (   As covered by your insurance benefits)  Hypnosis for smoking cessation  [employer] [phone]  Acupuncture for smoking cessation  [facility] [phone]   

## 2023-01-10 NOTE — Progress Notes (Signed)
Virtual Visit via Telephone Note  I connected with Samul Dada on 01/10/23 at  2:00 PM EST by telephone and verified that I am speaking with the correct person using two identifiers.  Location: Patient: At Home  Provider: Pulmonary Rehab    I discussed the limitations, risks, security and privacy concerns of performing an evaluation and management service by telephone and the availability of in person appointments. I also discussed with the patient that there may be a patient responsible charge related to this service. The patient expressed understanding and agreed to proceed.    Shared Decision Making Visit Lung Cancer Screening Program [phone])   Eligibility: Age 67 y.o. Pack Years Smoking History Calculation  43 pack year smoking history (# packs/per year x # years smoked) Recent History of coughing up blood  no Unexplained weight loss? no ( >Than 15 pounds within the last 6 months ) Prior History Lung / other cancer no (Diagnosis within the last 5 years already requiring surveillance chest CT Scans). Smoking Status Current Smoker Former Smokers: Years since quit:  NA  Quit Date:  NA  Visit Components: Discussion included one or more decision making aids. yes Discussion included risk/benefits of screening. yes Discussion included potential follow up diagnostic testing for abnormal scans. yes Discussion included meaning and risk of over diagnosis. yes Discussion included meaning and risk of False Positives. yes Discussion included meaning of total radiation exposure. yes  Counseling Included: Importance of adherence to annual lung cancer LDCT screening. yes Impact of comorbidities on ability to participate in the program. yes Ability and willingness to under diagnostic treatment. yes  Smoking Cessation Counseling: Current Smokers:  Discussed importance of smoking cessation. yes Information about tobacco cessation classes and interventions provided to patient.  yes Patient provided with "ticket" for LDCT Scan. yes Symptomatic Patient. no  Counseling NA Diagnosis Code: Tobacco Use Z72.0 Asymptomatic Patient  NA  Counseling (Intermediate counseling: > three minutes counseling) [id number] Former Smokers:  Discussed the importance of maintaining cigarette abstinence. yes Diagnosis Code: Personal History of Nicotine Dependence. [id number] Information about tobacco cessation classes and interventions provided to patient. Yes Patient provided with "ticket" for LDCT Scan. yes Written Order for Lung Cancer Screening with LDCT placed in Epic. Yes (CT Chest Lung Cancer Screening Low Dose W/O CM) [id number] Z12.2-Screening of respiratory organs Z87.891-Personal history of nicotine dependence  I have spent 25 minutes of face to face/ virtual visit   time with  Ms. Azari  discussing the risks and benefits of lung cancer screening. We viewed / discussed a power point together that explained in detail the above noted topics. We paused at intervals to allow for questions to be asked and answered to ensure understanding.We discussed that the single most powerful action that she can take to decrease her risk of developing lung cancer is to quit smoking. We discussed whether or not she is ready to commit to setting a quit date. We discussed options for tools to aid in quitting smoking including nicotine replacement therapy, non-nicotine medications, support groups, Quit Smart classes, and behavior modification. We discussed that often times setting smaller, more achievable goals, such as eliminating 1 cigarette a day for a week and then 2 cigarettes a day for a week can be helpful in slowly decreasing the number of cigarettes smoked. This allows for a sense of accomplishment as well as providing a clinical benefit. I provided  her  with smoking cessation  information  with contact information for community resources, classes, free nicotine  replacement therapy, and access to mobile apps,  text messaging, and on-line smoking cessation help. I have also provided  her  the office contact information in the event she needs to contact me, or the screening staff. We discussed the time and location of the scan, and that either Doroteo Glassman RN, Joella Prince, RN  or I will call / send a letter with the results within 24-72 hours of receiving them. The patient verbalized understanding of all of  the above and had no further questions upon leaving the office. They have my contact information in the event they have any further questions.  I spent 3 minutes counseling on smoking cessation and the health risks of continued tobacco abuse.  I explained to the patient that there has been a high incidence of coronary artery disease noted on these exams. I explained that this is a non-gated exam therefore degree or severity cannot be determined. This patient is not on statin therapy. I have asked the patient to follow-up with their PCP regarding any incidental finding of coronary artery disease and management with diet or medication as their PCP  feels is clinically indicated. The patient verbalized understanding of the above and had no further questions upon completion of the visit.      Magdalen Spatz, NP 01/10/2023

## 2023-01-11 ENCOUNTER — Inpatient Hospital Stay: Admission: RE | Admit: 2023-01-11 | Payer: 59 | Source: Ambulatory Visit

## 2023-01-11 ENCOUNTER — Ambulatory Visit
Admission: RE | Admit: 2023-01-11 | Discharge: 2023-01-11 | Disposition: A | Discharge status: Home / Self Care | Payer: 59 | Source: Ambulatory Visit | Attending: Acute Care | Admitting: Acute Care

## 2023-01-11 DIAGNOSIS — Z122 Encounter for screening for malignant neoplasm of respiratory organs: Secondary | ICD-10-CM

## 2023-01-11 DIAGNOSIS — Z87891 Personal history of nicotine dependence: Secondary | ICD-10-CM

## 2023-01-11 DIAGNOSIS — F17210 Nicotine dependence, cigarettes, uncomplicated: Secondary | ICD-10-CM

## 2023-01-11 DIAGNOSIS — R69 Illness, unspecified: Secondary | ICD-10-CM | POA: Diagnosis not present

## 2023-01-12 ENCOUNTER — Other Ambulatory Visit: Payer: Self-pay | Admitting: Acute Care

## 2023-01-12 DIAGNOSIS — Z87891 Personal history of nicotine dependence: Secondary | ICD-10-CM

## 2023-01-12 DIAGNOSIS — Z122 Encounter for screening for malignant neoplasm of respiratory organs: Secondary | ICD-10-CM

## 2023-01-12 DIAGNOSIS — F17210 Nicotine dependence, cigarettes, uncomplicated: Secondary | ICD-10-CM

## 2023-01-26 DIAGNOSIS — E782 Mixed hyperlipidemia: Secondary | ICD-10-CM | POA: Diagnosis not present

## 2023-01-26 DIAGNOSIS — E559 Vitamin D deficiency, unspecified: Secondary | ICD-10-CM | POA: Diagnosis not present

## 2023-01-26 DIAGNOSIS — Z0000 Encounter for general adult medical examination without abnormal findings: Secondary | ICD-10-CM | POA: Diagnosis not present

## 2023-01-27 LAB — CMP14+EGFR
ALT: 8 IU/L (ref 0–32)
AST: 10 IU/L (ref 0–40)
Albumin/Globulin Ratio: 2.1 (ref 1.2–2.2)
Albumin: 4.5 g/dL (ref 3.9–4.9)
Alkaline Phosphatase: 72 IU/L (ref 44–121)
BUN/Creatinine Ratio: 10 — ABNORMAL LOW (ref 12–28)
BUN: 10 mg/dL (ref 8–27)
Bilirubin Total: 0.6 mg/dL (ref 0.0–1.2)
CO2: 27 mmol/L (ref 20–29)
Calcium: 9.2 mg/dL (ref 8.7–10.3)
Chloride: 100 mmol/L (ref 96–106)
Creatinine, Ser: 0.98 mg/dL (ref 0.57–1.00)
Globulin, Total: 2.1 g/dL (ref 1.5–4.5)
Glucose: 91 mg/dL (ref 70–99)
Potassium: 4.7 mmol/L (ref 3.5–5.2)
Sodium: 141 mmol/L (ref 134–144)
Total Protein: 6.6 g/dL (ref 6.0–8.5)
eGFR: 64 mL/min/{1.73_m2} (ref >59)

## 2023-01-27 LAB — CBC WITH DIFFERENTIAL/PLATELET
Basophils Absolute: 0.1 10*3/uL (ref 0.0–0.2)
Basos: 1 %
EOS (ABSOLUTE): 0.1 10*3/uL (ref 0.0–0.4)
Eos: 1 %
Hematocrit: 43.5 % (ref 34.0–46.6)
Hemoglobin: 14.4 g/dL (ref 11.1–15.9)
Immature Grans (Abs): 0 10*3/uL (ref 0.0–0.1)
Immature Granulocytes: 1 %
Lymphocytes Absolute: 2 10*3/uL (ref 0.7–3.1)
Lymphs: 32 %
MCH: 31.6 pg (ref 26.6–33.0)
MCHC: 33.1 g/dL (ref 31.5–35.7)
MCV: 96 fL (ref 79–97)
Monocytes Absolute: 0.5 10*3/uL (ref 0.1–0.9)
Monocytes: 7 %
Neutrophils Absolute: 3.7 10*3/uL (ref 1.4–7.0)
Neutrophils: 58 %
Platelets: 227 10*3/uL (ref 150–450)
RBC: 4.55 x10E6/uL (ref 3.77–5.28)
RDW: 12.8 % (ref 11.7–15.4)
WBC: 6.3 10*3/uL (ref 3.4–10.8)

## 2023-01-27 LAB — LIPID PANEL
Chol/HDL Ratio: 3.9 ratio (ref 0.0–4.4)
Cholesterol, Total: 224 mg/dL — ABNORMAL HIGH (ref 100–199)
HDL: 57 mg/dL (ref >39)
LDL Chol Calc (NIH): 143 mg/dL — ABNORMAL HIGH (ref 0–99)
Triglycerides: 135 mg/dL (ref 0–149)
VLDL Cholesterol Cal: 24 mg/dL (ref 5–40)

## 2023-01-27 LAB — TSH+FREE T4
Free T4: 1.08 ng/dL (ref 0.82–1.77)
TSH: 1.16 u[IU]/mL (ref 0.450–4.500)

## 2023-01-27 LAB — VITAMIN D 25 HYDROXY (VIT D DEFICIENCY, FRACTURES): Vit D, 25-Hydroxy: 69.5 ng/mL (ref 30.0–100.0)

## 2023-01-31 NOTE — Progress Notes (Signed)
Labs are normal except for elevated cholesterol level -- LDL 143, and total cholesterol 224. ---recommend limit red meat intake, increase lean proteins such as chicken, [place] and fish. Take a fish oil or flaxseed oil supplement and be more active such as walking 10 minutes daily.

## 2023-02-01 ENCOUNTER — Telehealth: Payer: Self-pay

## 2023-02-01 NOTE — Telephone Encounter (Signed)
Left message for patient to give office a call back.  

## 2023-02-27 DIAGNOSIS — Z9889 Other specified postprocedural states: Secondary | ICD-10-CM | POA: Diagnosis not present

## 2023-02-27 DIAGNOSIS — H02832 Dermatochalasis of right lower eyelid: Secondary | ICD-10-CM | POA: Diagnosis not present

## 2023-02-27 DIAGNOSIS — H02835 Dermatochalasis of left lower eyelid: Secondary | ICD-10-CM | POA: Diagnosis not present

## 2023-02-27 DIAGNOSIS — H02831 Dermatochalasis of right upper eyelid: Secondary | ICD-10-CM | POA: Diagnosis not present

## 2023-02-27 DIAGNOSIS — H02834 Dermatochalasis of left upper eyelid: Secondary | ICD-10-CM | POA: Diagnosis not present

## 2023-03-30 ENCOUNTER — Other Ambulatory Visit: Payer: Self-pay | Admitting: Nurse Practitioner

## 2023-03-30 DIAGNOSIS — F411 Generalized anxiety disorder: Secondary | ICD-10-CM

## 2023-03-30 NOTE — Telephone Encounter (Signed)
Last 01/03/23 and next 04/03/23

## 2023-04-03 ENCOUNTER — Ambulatory Visit: Payer: 59 | Admitting: Nurse Practitioner

## 2023-04-03 ENCOUNTER — Encounter: Payer: Self-pay | Admitting: Nurse Practitioner

## 2023-04-03 VITALS — BP 120/70 | HR 67 | Temp 97.3°F | Resp 16 | Ht 62.0 in | Wt 148.4 lb

## 2023-04-03 DIAGNOSIS — E559 Vitamin D deficiency, unspecified: Secondary | ICD-10-CM

## 2023-04-03 DIAGNOSIS — F411 Generalized anxiety disorder: Secondary | ICD-10-CM | POA: Diagnosis not present

## 2023-04-03 DIAGNOSIS — Q181 Preauricular sinus and cyst: Secondary | ICD-10-CM

## 2023-04-03 DIAGNOSIS — K219 Gastro-esophageal reflux disease without esophagitis: Secondary | ICD-10-CM | POA: Diagnosis not present

## 2023-04-03 DIAGNOSIS — I10 Essential (primary) hypertension: Secondary | ICD-10-CM | POA: Diagnosis not present

## 2023-04-03 MED ORDER — ALPRAZOLAM 0.5 MG PO TABS: 0.5000 mg | ORAL_TABLET | Freq: Two times a day (BID) | ORAL | 2 refills | Status: DC | PRN

## 2023-04-03 MED ORDER — CITALOPRAM HYDROBROMIDE 40 MG PO TABS: 20.0000 mg | ORAL_TABLET | Freq: Two times a day (BID) | ORAL | 1 refills | Status: DC

## 2023-04-03 MED ORDER — AMLODIPINE BESYLATE 5 MG PO TABS: 5.0000 mg | ORAL_TABLET | Freq: Every day | ORAL | 1 refills | Status: DC

## 2023-04-03 MED ORDER — ERGOCALCIFEROL 1.25 MG (50000 UT) PO CAPS: 50000.0000 [IU] | ORAL_CAPSULE | ORAL | 1 refills | Status: DC

## 2023-04-03 MED ORDER — FAMOTIDINE 20 MG PO TABS: 20.0000 mg | ORAL_TABLET | Freq: Two times a day (BID) | ORAL | 3 refills | Status: DC

## 2023-04-03 NOTE — Progress Notes (Signed)
[facility] [address] [place], [place] [address]  Internal MEDICINE  Office Visit Note  Patient Name: Felicia Blackwell  [id number]  [id number]  Date of Service: 04/03/2023  Chief Complaint  Patient presents with   Depression   Gastroesophageal Reflux   Follow-up    HPI Ursela presents for a follow-up visit for anxiety, hypertension and refills.  Anxiety -- takes alprazolam, slightly improved now that she is not having to travel and deal with her brother.  Mother passed away on easter which was stressful because she did not find out from her family members and there was not even an Airline pilot or a funeral.  Has been trying to quit smoking and goes back and forth with it Cyst  on right ear  -- small, intact cyst. No signs of inflammation, swelling or infection.  Hypertension -- blood pressure is controlled, takes amlodipine 5 mg daily.     Current Medication: Outpatient Encounter Medications as of 04/03/2023  Medication Sig   albuterol (VENTOLIN HFA) 108 (90 Base) MCG/ACT inhaler Inhale 2 puffs into the lungs every 6 (six) hours as needed for wheezing or shortness of breath.   alendronate (FOSAMAX) 70 MG tablet Take 1 tablet (70 mg total) by mouth once a week. Take with a full glass of water on an empty stomach.   ipratropium-albuterol (DUONEB) 0.5-2.5 (3) MG/3ML SOLN Take 3 mLs by nebulization every 6 (six) hours as needed.   levocetirizine (XYZAL) 5 MG tablet Take 1 tablet (5 mg total) by mouth every evening.   Multiple Vitamins-Minerals (MULTIVITAMIN ADULT PO) Take by mouth daily.   [DISCONTINUED] ALPRAZolam (XANAX) 0.5 MG tablet Take 1 tablet (0.5 mg total) by mouth 2 (two) times daily as needed for anxiety.   [DISCONTINUED] amLODipine (NORVASC) 5 MG tablet Take 1 tablet (5 mg total) by mouth daily.   [DISCONTINUED] citalopram (CELEXA) 40 MG tablet Take 0.5 tablets (20 mg total) by mouth 2 (two) times daily.   [DISCONTINUED] ergocalciferol (VITAMIN D2) 1.25 MG (50000  UT) capsule Take 1 capsule (50,000 Units total) by mouth once a week.   [DISCONTINUED] famotidine (PEPCID) 20 MG tablet Take 1 tablet (20 mg total) by mouth 2 (two) times daily.   ALPRAZolam (XANAX) 0.5 MG tablet Take 1 tablet (0.5 mg total) by mouth 2 (two) times daily as needed for anxiety.   amLODipine (NORVASC) 5 MG tablet Take 1 tablet (5 mg total) by mouth daily.   citalopram (CELEXA) 40 MG tablet Take 0.5 tablets (20 mg total) by mouth 2 (two) times daily.   ergocalciferol (VITAMIN D2) 1.25 MG (50000 UT) capsule Take 1 capsule (50,000 Units total) by mouth once a week.   famotidine (PEPCID) 20 MG tablet Take 1 tablet (20 mg total) by mouth 2 (two) times daily.   No facility-administered encounter medications on file as of 04/03/2023.    Surgical History: Past Surgical History:  Procedure Laterality Date   TUBAL LIGATION      Medical History: Past Medical History:  Diagnosis Date   Anxiety    Depression    GERD (gastroesophageal reflux disease)    Osteoporosis     Family History: Family History  Problem Relation Age of Onset   Heart disease Mother    Hyperlipidemia Mother    Macular degeneration Mother    Glaucoma Mother    Hyperlipidemia Father    Aneurysm Father     Social History   Socioeconomic History   Marital status: Married    Spouse name:  Not on file   Number of children: Not on file   Years of education: Not on file   Highest education level: Not on file  Occupational History   Not on file  Tobacco Use   Smoking status: Every Day    Packs/day: 1.00    Years: 43.00    Additional pack years: 0.00    Total pack years: 43.00    Types: Cigarettes   Smokeless tobacco: Never   Tobacco comments:    pt smoking 10 a day 02/25/21  Vaping Use   Vaping Use: Never used  Substance and Sexual Activity   Alcohol use: Not Currently   Drug use: Not Currently   Sexual activity: Not on file  Other Topics Concern   Not on file  Social History Narrative   Not  on file   Social Determinants of Health   Financial Resource Strain: Not on file  Food Insecurity: Not on file  Transportation Needs: Not on file  Physical Activity: Not on file  Stress: Not on file  Social Connections: Not on file  Intimate Partner Violence: Not on file      Review of Systems  Constitutional:  Negative for chills, fatigue and unexpected weight change.  HENT:  Negative for congestion, rhinorrhea, sneezing and sore throat.   Eyes:  Negative for redness.  Respiratory: Negative.  Negative for cough, chest tightness, shortness of breath and wheezing.   Cardiovascular: Negative.  Negative for chest pain and palpitations.  Gastrointestinal:  Positive for constipation and nausea. Negative for abdominal pain, diarrhea and vomiting.  Genitourinary:  Negative for dysuria and frequency.  Musculoskeletal:  Negative for arthralgias, back pain, joint swelling and neck pain.  Skin:  Negative for rash.  Neurological: Negative.  Negative for tremors and numbness.  Hematological:  Negative for adenopathy. Does not bruise/bleed easily.  Psychiatric/Behavioral:  Positive for sleep disturbance. Negative for behavioral problems (Depression), self-injury and suicidal ideas. The patient is nervous/anxious.     Vital Signs: BP (!) 140/90   Pulse 67   Temp (!) 97.3 F (36.3 C)   Resp 16   Ht 5\' 2"  (1.575 m)   Wt 148 lb 6.4 oz (67.3 kg)   SpO2 96%   BMI 27.14 kg/m    Physical Exam Vitals reviewed.  Constitutional:      General: She is not in acute distress.    Appearance: Normal appearance. She is not ill-appearing.  HENT:     Head: Normocephalic and atraumatic.  Eyes:     Pupils: Pupils are equal, round, and reactive to light.  Cardiovascular:     Rate and Rhythm: Normal rate and regular rhythm.  Pulmonary:     Effort: Pulmonary effort is normal. No respiratory distress.  Neurological:     Mental Status: She is alert and oriented to person, place, and time.   Psychiatric:        Mood and Affect: Mood normal.        Behavior: Behavior normal.        Assessment/Plan: 1. Essential hypertension Stable, continue amlodipine as prescribed.  - amLODipine (NORVASC) 5 MG tablet; Take 1 tablet (5 mg total) by mouth daily.  Dispense: 90 tablet; Refill: 1  2. Cyst on ear Will continue to watch and monitor for changes such as signs of infection, increase in size or increase in pain/tenderness. Patient will call the clinic if there are any changes.   3. Gastroesophageal reflux disease without esophagitis Continue famotidine as prescribed.  -  famotidine (PEPCID) 20 MG tablet; Take 1 tablet (20 mg total) by mouth 2 (two) times daily.  Dispense: 60 tablet; Refill: 3  4. Vitamin D deficiency Continue weekly vitamin D supplement as prescribed.  - ergocalciferol (VITAMIN D2) 1.25 MG (50000 UT) capsule; Take 1 capsule (50,000 Units total) by mouth once a week.  Dispense: 12 capsule; Refill: 1  5. Generalized anxiety disorder Continue citalopram and prn alprazolam as prescribed. Follow up in 3 months for additional refills.  - ALPRAZolam (XANAX) 0.5 MG tablet; Take 1 tablet (0.5 mg total) by mouth 2 (two) times daily as needed for anxiety.  Dispense: 60 tablet; Refill: 2 - citalopram (CELEXA) 40 MG tablet; Take 0.5 tablets (20 mg total) by mouth 2 (two) times daily.  Dispense: 90 tablet; Refill: 1   General Counseling: Rechelle verbalizes understanding of the findings of todays visit and agrees with plan of treatment. I have discussed any further diagnostic evaluation that may be needed or ordered today. We also reviewed her medications today. she has been encouraged to call the office with any questions or concerns that should arise related to todays visit.    No orders of the defined types were placed in this encounter.   Meds ordered this encounter  Medications   ALPRAZolam (XANAX) 0.5 MG tablet    Sig: Take 1 tablet (0.5 mg total) by mouth 2 (two)  times daily as needed for anxiety.    Dispense:  60 tablet    Refill:  2    For future refills. This medication is medically necessary for the patient to have, please do not let her run out.   ergocalciferol (VITAMIN D2) 1.25 MG (50000 UT) capsule    Sig: Take 1 capsule (50,000 Units total) by mouth once a week.    Dispense:  12 capsule    Refill:  1   citalopram (CELEXA) 40 MG tablet    Sig: Take 0.5 tablets (20 mg total) by mouth 2 (two) times daily.    Dispense:  90 tablet    Refill:  1   amLODipine (NORVASC) 5 MG tablet    Sig: Take 1 tablet (5 mg total) by mouth daily.    Dispense:  90 tablet    Refill:  1   famotidine (PEPCID) 20 MG tablet    Sig: Take 1 tablet (20 mg total) by mouth 2 (two) times daily.    Dispense:  60 tablet    Refill:  3    Return for F/U, anxiety med refill, Chantalle Defilippo PCP.   Total time spent:30 Minutes Time spent includes review of chart, medications, test results, and follow up plan with the patient.   [place] Controlled Substance Database was reviewed by me.  This patient was seen by Sallyanne Kuster, FNP-C in collaboration with Dr. Beverely Risen as a part of collaborative care agreement.   Veverly Larimer R. Tedd Sias, MSN, FNP-C Internal medicine

## 2023-04-03 NOTE — Telephone Encounter (Signed)
Will do at her appointment

## 2023-07-06 ENCOUNTER — Ambulatory Visit: Payer: 59 | Admitting: Nurse Practitioner

## 2023-07-18 ENCOUNTER — Telehealth: Payer: 59 | Admitting: Internal Medicine

## 2023-07-18 ENCOUNTER — Encounter: Payer: Self-pay | Admitting: Internal Medicine

## 2023-07-18 ENCOUNTER — Other Ambulatory Visit: Payer: Self-pay

## 2023-07-18 VITALS — Resp 16 | Ht 62.0 in

## 2023-07-18 DIAGNOSIS — R062 Wheezing: Secondary | ICD-10-CM | POA: Diagnosis not present

## 2023-07-18 DIAGNOSIS — U071 COVID-19: Secondary | ICD-10-CM

## 2023-07-18 MED ORDER — NIRMATRELVIR/RITONAVIR (PAXLOVID)TABLET: 3.0000 | ORAL_TABLET | Freq: Two times a day (BID) | ORAL | 0 refills | Status: DC

## 2023-07-18 MED ORDER — ALBUTEROL SULFATE HFA 108 (90 BASE) MCG/ACT IN AERS: 2.0000 | INHALATION_SPRAY | Freq: Four times a day (QID) | RESPIRATORY_TRACT | 3 refills | Status: DC | PRN

## 2023-07-18 MED ORDER — AZITHROMYCIN 250 MG PO TABS: ORAL_TABLET | ORAL | 0 refills | Status: DC

## 2023-07-18 MED ORDER — PREDNISONE 10 MG (21) PO TBPK: ORAL_TABLET | ORAL | 0 refills | Status: DC

## 2023-07-18 MED ORDER — NIRMATRELVIR/RITONAVIR (PAXLOVID)TABLET: 3.0000 | ORAL_TABLET | Freq: Two times a day (BID) | ORAL | 0 refills | Status: AC

## 2023-07-18 NOTE — Progress Notes (Signed)
[facility] [address] [place], [place] [address]  Internal MEDICINE  Telephone Visit  Patient Name: Felicia Blackwell  [id number]  [id number]  Date of Service: 08/01/2023  I connected with the patient at 10 am by telephone and verified the patients identity using two identifiers.   I discussed the limitations, risks, security and privacy concerns of performing an evaluation and management service by telephone and the availability of in person appointments. I also discussed with the patient that there may be a patient responsible charge related to the service.  The patient expressed understanding and agrees to proceed.    Chief Complaint  Patient presents with   Telephone Assessment    Patient prefers telephone call: [phone]   Telephone Screen   Covid Positive   Headache   Diarrhea   Nausea   Fever   Generalized Body Aches    HPI  Pt is connected via telephone Fever and chills since Saturday  Covid positive yesterday  Smoker  2 covid vaccine    Current Medication: Outpatient Encounter Medications as of 07/18/2023  Medication Sig   famotidine (PEPCID) 20 MG tablet Take 1 tablet (20 mg total) by mouth 2 (two) times daily.   ipratropium-albuterol (DUONEB) 0.5-2.5 (3) MG/3ML SOLN Take 3 mLs by nebulization every 6 (six) hours as needed.   levocetirizine (XYZAL) 5 MG tablet Take 1 tablet (5 mg total) by mouth every evening.   Multiple Vitamins-Minerals (MULTIVITAMIN ADULT PO) Take by mouth daily.   [DISCONTINUED] albuterol (VENTOLIN HFA) 108 (90 Base) MCG/ACT inhaler Inhale 2 puffs into the lungs every 6 (six) hours as needed for wheezing or shortness of breath.   [DISCONTINUED] alendronate (FOSAMAX) 70 MG tablet Take 1 tablet (70 mg total) by mouth once a week. Take with a full glass of water on an empty stomach.   [DISCONTINUED] ALPRAZolam (XANAX) 0.5 MG tablet Take 1 tablet (0.5 mg total) by mouth 2 (two) times daily as needed for anxiety.   [DISCONTINUED]  amLODipine (NORVASC) 5 MG tablet Take 1 tablet (5 mg total) by mouth daily.   [DISCONTINUED] azithromycin (ZITHROMAX) 250 MG tablet Take one tab a day for 10 days for uri   [DISCONTINUED] citalopram (CELEXA) 40 MG tablet Take 0.5 tablets (20 mg total) by mouth 2 (two) times daily.   [DISCONTINUED] ergocalciferol (VITAMIN D2) 1.25 MG (50000 UT) capsule Take 1 capsule (50,000 Units total) by mouth once a week.   [DISCONTINUED] nirmatrelvir/ritonavir (PAXLOVID) 20 x 150 MG & 10 x 100MG  TABS Take 3 tablets by mouth 2 (two) times daily for 5 days. (Take nirmatrelvir 150 mg two tablets twice daily for 5 days and ritonavir 100 mg one tablet twice daily for 5 days) Patient GFR is 0.9   [DISCONTINUED] predniSONE (STERAPRED UNI-PAK 21 TAB) 10 MG (21) TBPK tablet Use as directed for 6 days   [DISCONTINUED] albuterol (VENTOLIN HFA) 108 (90 Base) MCG/ACT inhaler Inhale 2 puffs into the lungs every 6 (six) hours as needed for wheezing or shortness of breath.   No facility-administered encounter medications on file as of 07/18/2023.    Surgical History: Past Surgical History:  Procedure Laterality Date   TUBAL LIGATION      Medical History: Past Medical History:  Diagnosis Date   Anxiety    Depression    GERD (gastroesophageal reflux disease)    Osteoporosis     Family History: Family History  Problem Relation Age of Onset   Heart disease Mother    Hyperlipidemia Mother  Macular degeneration Mother    Glaucoma Mother    Hyperlipidemia Father    Aneurysm Father     Social History   Socioeconomic History   Marital status: Married    Spouse name: Not on file   Number of children: Not on file   Years of education: Not on file   Highest education level: Not on file  Occupational History   Not on file  Tobacco Use   Smoking status: Every Day    Current packs/day: 1.00    Average packs/day: 1 pack/day for 43.0 years (43.0 ttl pk-yrs)    Types: Cigarettes   Smokeless tobacco: Never    Tobacco comments:    pt smoking 10 a day 02/25/21  Vaping Use   Vaping status: Never Used  Substance and Sexual Activity   Alcohol use: Not Currently   Drug use: Not Currently   Sexual activity: Not on file  Other Topics Concern   Not on file  Social History Narrative   Not on file   Social Determinants of Health   Financial Resource Strain: Not on file  Food Insecurity: Not on file  Transportation Needs: Not on file  Physical Activity: Not on file  Stress: Not on file  Social Connections: Not on file  Intimate Partner Violence: Not on file      Review of Systems  Constitutional:  Negative for fatigue and fever.  HENT:  Negative for congestion, mouth sores and postnasal drip.   Respiratory:  Positive for cough, shortness of breath and wheezing.   Cardiovascular:  Negative for chest pain.  Genitourinary:  Negative for flank pain.  Psychiatric/Behavioral: Negative.      Vital Signs: Resp 16   Ht 5\' 2"  (1.575 m)   BMI 27.14 kg/m    Observation/Objective: Congested     Assessment/Plan: 1. Wheezing Pt is instructed to use her inhalers regularly   2. COVID Antiviral therapy, monitor    General Counseling: Hevin verbalizes understanding of the findings of today's phone visit and agrees with plan of treatment. I have discussed any further diagnostic evaluation that may be needed or ordered today. We also reviewed her medications today. she has been encouraged to call the office with any questions or concerns that should arise related to todays visit.    No orders of the defined types were placed in this encounter.   Meds ordered this encounter  Medications   DISCONTD: nirmatrelvir/ritonavir (PAXLOVID) 20 x 150 MG & 10 x 100MG  TABS    Sig: Take 3 tablets by mouth 2 (two) times daily for 5 days. (Take nirmatrelvir 150 mg two tablets twice daily for 5 days and ritonavir 100 mg one tablet twice daily for 5 days) Patient GFR is 0.9    Dispense:  30 tablet     Refill:  0   DISCONTD: azithromycin (ZITHROMAX) 250 MG tablet    Sig: Take one tab a day for 10 days for uri    Dispense:  10 tablet    Refill:  0   DISCONTD: predniSONE (STERAPRED UNI-PAK 21 TAB) 10 MG (21) TBPK tablet    Sig: Use as directed for 6 days    Dispense:  21 tablet    Refill:  0   DISCONTD: albuterol (VENTOLIN HFA) 108 (90 Base) MCG/ACT inhaler    Sig: Inhale 2 puffs into the lungs every 6 (six) hours as needed for wheezing or shortness of breath.    Dispense:  18 g    Refill:  3    Time spent:20 Minutes    Dr Lyndon Code Internal medicine

## 2023-07-20 ENCOUNTER — Other Ambulatory Visit: Payer: Self-pay

## 2023-07-20 ENCOUNTER — Telehealth: Payer: Self-pay

## 2023-07-20 DIAGNOSIS — F411 Generalized anxiety disorder: Secondary | ICD-10-CM

## 2023-07-20 DIAGNOSIS — M810 Age-related osteoporosis without current pathological fracture: Secondary | ICD-10-CM

## 2023-07-20 DIAGNOSIS — I10 Essential (primary) hypertension: Secondary | ICD-10-CM

## 2023-07-20 MED ORDER — CITALOPRAM HYDROBROMIDE 40 MG PO TABS: 20.0000 mg | ORAL_TABLET | Freq: Two times a day (BID) | ORAL | 1 refills | Status: DC

## 2023-07-20 MED ORDER — ALENDRONATE SODIUM 70 MG PO TABS: 70.0000 mg | ORAL_TABLET | ORAL | 4 refills | Status: DC

## 2023-07-20 MED ORDER — ALPRAZOLAM 0.5 MG PO TABS: 0.5000 mg | ORAL_TABLET | Freq: Two times a day (BID) | ORAL | 0 refills | Status: DC | PRN

## 2023-07-20 MED ORDER — AMLODIPINE BESYLATE 5 MG PO TABS: 5.0000 mg | ORAL_TABLET | Freq: Every day | ORAL | 1 refills | Status: DC

## 2023-07-20 NOTE — Telephone Encounter (Signed)
Pt advised we sent med  

## 2023-07-20 NOTE — Telephone Encounter (Signed)
Pt called that she is feeling much better after she is taking paxlovid and send to [facility]

## 2023-07-26 ENCOUNTER — Ambulatory Visit: Payer: 59 | Admitting: Nurse Practitioner

## 2023-08-01 ENCOUNTER — Ambulatory Visit: Payer: 59 | Admitting: Nurse Practitioner

## 2023-08-01 ENCOUNTER — Encounter: Payer: Self-pay | Admitting: Nurse Practitioner

## 2023-08-01 VITALS — BP 135/74 | HR 71 | Temp 98.4°F | Resp 16 | Ht 62.0 in | Wt 148.0 lb

## 2023-08-01 DIAGNOSIS — F411 Generalized anxiety disorder: Secondary | ICD-10-CM

## 2023-08-01 DIAGNOSIS — I10 Essential (primary) hypertension: Secondary | ICD-10-CM

## 2023-08-01 DIAGNOSIS — K219 Gastro-esophageal reflux disease without esophagitis: Secondary | ICD-10-CM

## 2023-08-01 MED ORDER — ALPRAZOLAM 0.5 MG PO TABS: 0.5000 mg | ORAL_TABLET | Freq: Two times a day (BID) | ORAL | 2 refills | Status: DC | PRN

## 2023-08-01 NOTE — Progress Notes (Signed)
[facility] [address] [place], [place] [address]  Internal MEDICINE  Office Visit Note  Patient Name: Felicia Blackwell  [id number]  [id number]  Date of Service: 08/01/2023  Chief Complaint  Patient presents with   Depression   Gastroesophageal Reflux   Follow-up    HPI Felicia Blackwell presents for a follow-up visit for anxiety, smoking, fatigue, blood pressure.  Anxiety --takes alprazolam which controls it well. She is doing much better now that she is not spending so much time around family. Still takes alprazolam as needed, due for refills today.  Smoking -- quit for a month but recently started smoking again, less than 1/2 ppd.  Does not need vitamin D level is 69.5  Hypertension -- takes amlodipine and BP is controlled.  Recent covid -- still recovering, reports residual fatigue, low energy and lingering cough   Current Medication: Outpatient Encounter Medications as of 08/01/2023  Medication Sig   albuterol (VENTOLIN HFA) 108 (90 Base) MCG/ACT inhaler Inhale 2 puffs into the lungs every 6 (six) hours as needed for wheezing or shortness of breath.   alendronate (FOSAMAX) 70 MG tablet Take 1 tablet (70 mg total) by mouth once a week. Take with a full glass of water on an empty stomach.   ipratropium-albuterol (DUONEB) 0.5-2.5 (3) MG/3ML SOLN Take 3 mLs by nebulization every 6 (six) hours as needed.   levocetirizine (XYZAL) 5 MG tablet Take 1 tablet (5 mg total) by mouth every evening.   Multiple Vitamins-Minerals (MULTIVITAMIN ADULT PO) Take by mouth daily.   [DISCONTINUED] ALPRAZolam (XANAX) 0.5 MG tablet Take 1 tablet (0.5 mg total) by mouth 2 (two) times daily as needed for anxiety.   [DISCONTINUED] amLODipine (NORVASC) 5 MG tablet Take 1 tablet (5 mg total) by mouth daily.   [DISCONTINUED] azithromycin (ZITHROMAX) 250 MG tablet Take one tab a day for 10 days for uri   [DISCONTINUED] citalopram (CELEXA) 40 MG tablet Take 0.5 tablets (20 mg total) by mouth 2 (two) times  daily.   [DISCONTINUED] ergocalciferol (VITAMIN D2) 1.25 MG (50000 UT) capsule Take 1 capsule (50,000 Units total) by mouth once a week.   [DISCONTINUED] famotidine (PEPCID) 20 MG tablet Take 1 tablet (20 mg total) by mouth 2 (two) times daily.   [DISCONTINUED] predniSONE (STERAPRED UNI-PAK 21 TAB) 10 MG (21) TBPK tablet Use as directed for 6 days   ALPRAZolam (XANAX) 0.5 MG tablet Take 1 tablet (0.5 mg total) by mouth 2 (two) times daily as needed for anxiety.   No facility-administered encounter medications on file as of 08/01/2023.    Surgical History: Past Surgical History:  Procedure Laterality Date   TUBAL LIGATION      Medical History: Past Medical History:  Diagnosis Date   Anxiety    Depression    GERD (gastroesophageal reflux disease)    Osteoporosis     Family History: Family History  Problem Relation Age of Onset   Heart disease Mother    Hyperlipidemia Mother    Macular degeneration Mother    Glaucoma Mother    Hyperlipidemia Father    Aneurysm Father     Social History   Socioeconomic History   Marital status: Married    Spouse name: Not on file   Number of children: Not on file   Years of education: Not on file   Highest education level: Not on file  Occupational History   Not on file  Tobacco Use   Smoking status: Every Day    Current packs/day: 1.00  Average packs/day: 1 pack/day for 43.0 years (43.0 ttl pk-yrs)    Types: Cigarettes   Smokeless tobacco: Never   Tobacco comments:    pt smoking 10 a day 02/25/21  Vaping Use   Vaping status: Never Used  Substance and Sexual Activity   Alcohol use: Not Currently   Drug use: Not Currently   Sexual activity: Not on file  Other Topics Concern   Not on file  Social History Narrative   Not on file   Social Determinants of Health   Financial Resource Strain: Not on file  Food Insecurity: Not on file  Transportation Needs: Not on file  Physical Activity: Not on file  Stress: Not on file   Social Connections: Not on file  Intimate Partner Violence: Not on file      Review of Systems  Constitutional:  Negative for chills, fatigue and unexpected weight change.  HENT:  Negative for congestion, rhinorrhea, sneezing and sore throat.   Eyes:  Negative for redness.  Respiratory: Negative.  Negative for cough, chest tightness, shortness of breath and wheezing.   Cardiovascular: Negative.  Negative for chest pain and palpitations.  Gastrointestinal:  Positive for constipation and nausea. Negative for abdominal pain, diarrhea and vomiting.  Genitourinary:  Negative for dysuria and frequency.  Musculoskeletal:  Negative for arthralgias, back pain, joint swelling and neck pain.  Skin:  Negative for rash.  Neurological: Negative.  Negative for tremors and numbness.  Hematological:  Negative for adenopathy. Does not bruise/bleed easily.  Psychiatric/Behavioral:  Positive for sleep disturbance. Negative for behavioral problems (Depression), self-injury and suicidal ideas. The patient is nervous/anxious.     Vital Signs: BP 135/74   Pulse 71   Temp 98.4 F (36.9 C)   Resp 16   Ht 5\' 2"  (1.575 m)   Wt 148 lb (67.1 kg)   SpO2 95%   BMI 27.07 kg/m    Physical Exam Vitals reviewed.  Constitutional:      General: She is not in acute distress.    Appearance: Normal appearance. She is not ill-appearing.  HENT:     Head: Normocephalic and atraumatic.  Eyes:     Pupils: Pupils are equal, round, and reactive to light.  Cardiovascular:     Rate and Rhythm: Normal rate and regular rhythm.  Pulmonary:     Effort: Pulmonary effort is normal. No respiratory distress.  Neurological:     Mental Status: She is alert and oriented to person, place, and time.  Psychiatric:        Mood and Affect: Mood normal.        Behavior: Behavior normal.        Assessment/Plan: 1. Essential hypertension Stable continue medications as prescribed.   2. Gastroesophageal reflux disease  without esophagitis Stable, continue famotidine as prescribed.   3. Generalized anxiety disorder Continue prn alprazolam as prescribed. Follow up in 3 months for additional refills.  - ALPRAZolam (XANAX) 0.5 MG tablet; Take 1 tablet (0.5 mg total) by mouth 2 (two) times daily as needed for anxiety.  Dispense: 60 tablet; Refill: 2   General Counseling: Niurka verbalizes understanding of the findings of todays visit and agrees with plan of treatment. I have discussed any further diagnostic evaluation that may be needed or ordered today. We also reviewed her medications today. she has been encouraged to call the office with any questions or concerns that should arise related to todays visit.    No orders of the defined types were placed in this  encounter.   Meds ordered this encounter  Medications   ALPRAZolam (XANAX) 0.5 MG tablet    Sig: Take 1 tablet (0.5 mg total) by mouth 2 (two) times daily as needed for anxiety.    Dispense:  60 tablet    Refill:  2    This medication is medically necessary for the patient to have, please do not let her run out.    Return for previously scheduled in november with Arminda Foglio .   Total time spent:30 Minutes Time spent includes review of chart, medications, test results, and follow up plan with the patient.   [place] Controlled Substance Database was reviewed by me.  This patient was seen by Sallyanne Kuster, FNP-C in collaboration with Dr. Beverely Risen as a part of collaborative care agreement.   Jhania Etherington R. Tedd Sias, MSN, FNP-C Internal medicine

## 2023-08-11 ENCOUNTER — Other Ambulatory Visit: Payer: Self-pay | Admitting: Nurse Practitioner

## 2023-08-11 DIAGNOSIS — F411 Generalized anxiety disorder: Secondary | ICD-10-CM

## 2023-08-11 NOTE — Telephone Encounter (Signed)
Ok to send 90 days

## 2023-08-17 ENCOUNTER — Other Ambulatory Visit: Payer: Self-pay

## 2023-08-17 DIAGNOSIS — K219 Gastro-esophageal reflux disease without esophagitis: Secondary | ICD-10-CM

## 2023-08-17 DIAGNOSIS — I10 Essential (primary) hypertension: Secondary | ICD-10-CM

## 2023-08-17 MED ORDER — AMLODIPINE BESYLATE 5 MG PO TABS: 5.0000 mg | ORAL_TABLET | Freq: Every day | ORAL | 1 refills | Status: DC

## 2023-08-17 MED ORDER — FAMOTIDINE 20 MG PO TABS: 20.0000 mg | ORAL_TABLET | Freq: Two times a day (BID) | ORAL | 3 refills | Status: DC

## 2023-09-13 ENCOUNTER — Other Ambulatory Visit: Payer: Self-pay | Admitting: Nurse Practitioner

## 2023-09-13 DIAGNOSIS — F411 Generalized anxiety disorder: Secondary | ICD-10-CM

## 2023-09-16 ENCOUNTER — Encounter: Payer: Self-pay | Admitting: Nurse Practitioner

## 2023-09-26 ENCOUNTER — Other Ambulatory Visit: Payer: Self-pay | Admitting: Nurse Practitioner

## 2023-09-26 DIAGNOSIS — Z1231 Encounter for screening mammogram for malignant neoplasm of breast: Secondary | ICD-10-CM

## 2023-10-11 ENCOUNTER — Encounter: Payer: Self-pay | Admitting: Nurse Practitioner

## 2023-10-11 ENCOUNTER — Ambulatory Visit (INDEPENDENT_AMBULATORY_CARE_PROVIDER_SITE_OTHER): Payer: 59 | Admitting: Nurse Practitioner

## 2023-10-11 VITALS — BP 120/84 | HR 64 | Temp 98.1°F | Resp 16 | Ht 62.0 in | Wt 147.2 lb

## 2023-10-11 DIAGNOSIS — Z0001 Encounter for general adult medical examination with abnormal findings: Secondary | ICD-10-CM | POA: Diagnosis not present

## 2023-10-11 DIAGNOSIS — Z79899 Other long term (current) drug therapy: Secondary | ICD-10-CM

## 2023-10-11 DIAGNOSIS — L570 Actinic keratosis: Secondary | ICD-10-CM

## 2023-10-11 DIAGNOSIS — F411 Generalized anxiety disorder: Secondary | ICD-10-CM | POA: Diagnosis not present

## 2023-10-11 DIAGNOSIS — Q181 Preauricular sinus and cyst: Secondary | ICD-10-CM

## 2023-10-11 LAB — POCT URINE DRUG SCREEN
Methylenedioxyamphetamine: NOT DETECTED
POC Amphetamine UR: NOT DETECTED
POC BENZODIAZEPINES UR: POSITIVE — AB
POC Barbiturate UR: NOT DETECTED
POC Cocaine UR: NOT DETECTED
POC Ecstasy UR: NOT DETECTED
POC Marijuana UR: NOT DETECTED
POC Methadone UR: NOT DETECTED
POC Methamphetamine UR: NOT DETECTED
POC Opiate Ur: NOT DETECTED
POC Oxycodone UR: NOT DETECTED
POC PHENCYCLIDINE UR: NOT DETECTED
POC TRICYCLICS UR: NOT DETECTED

## 2023-10-11 MED ORDER — CITALOPRAM HYDROBROMIDE 40 MG PO TABS: 20.0000 mg | ORAL_TABLET | Freq: Two times a day (BID) | ORAL | 1 refills | Status: DC

## 2023-10-11 MED ORDER — ALPRAZOLAM 0.5 MG PO TABS: 0.5000 mg | ORAL_TABLET | Freq: Two times a day (BID) | ORAL | 2 refills | Status: DC | PRN

## 2023-10-11 NOTE — Progress Notes (Signed)
[facility]icia [facility] [address] [place], [place] [address]  Internal MEDICINE  Office Visit Note  Patient Name: Felicia Blackwell  [id number]  [id number]  Date of Service: 10/11/2023  Chief Complaint  Patient presents with   Depression   Gastroesophageal Reflux   Annual Exam    HPI [name] presents for an annual well visit and physical exam.  Well-appearing 67 y.o. female with hypertension, high cholesterol, depression, GERD, anxiety and fatigue.  Routine CRC screening: due for colonoscopy in December  Routine mammogram: schedule in December  DEXA scan: done last year, on alendronate Labs: due for cholesterol panel.  New or worsening pain: none Other concerns: none Osteoporosis -- taking alendronate    Current Medication: Outpatient Encounter Medications as of 10/11/2023  Medication Sig   albuterol (VENTOLIN HFA) 108 (90 Base) MCG/ACT inhaler Inhale 2 puffs into the lungs every 6 (six) hours as needed for wheezing or shortness of breath.   alendronate (FOSAMAX) 70 MG tablet Take 1 tablet (70 mg total) by mouth once a week. Take with a full glass of water on an empty stomach.   amLODipine (NORVASC) 5 MG tablet Take 1 tablet (5 mg total) by mouth daily.   famotidine (PEPCID) 20 MG tablet Take 1 tablet (20 mg total) by mouth 2 (two) times daily.   ipratropium-albuterol (DUONEB) 0.5-2.5 (3) MG/3ML SOLN Take 3 mLs by nebulization every 6 (six) hours as needed.   levocetirizine (XYZAL) 5 MG tablet Take 1 tablet (5 mg total) by mouth every evening.   Multiple Vitamins-Minerals (MULTIVITAMIN ADULT PO) Take by mouth daily.   [DISCONTINUED] ALPRAZolam (XANAX) 0.5 MG tablet Take 1 tablet (0.5 mg total) by mouth 2 (two) times daily as needed for anxiety.   [DISCONTINUED] citalopram (CELEXA) 40 MG tablet Take 0.5 tablets (20 mg total) by mouth 2 (two) times daily.   ALPRAZolam (XANAX) 0.5 MG tablet Take 1 tablet (0.5 mg total) by mouth 2 (two) times daily as needed for anxiety.    citalopram (CELEXA) 40 MG tablet Take 0.5 tablets (20 mg total) by mouth 2 (two) times daily.   No facility-administered encounter medications on file as of 10/11/2023.    Surgical History: Past Surgical History:  Procedure Laterality Date   TUBAL LIGATION      Medical History: Past Medical History:  Diagnosis Date   Anxiety    Depression    GERD (gastroesophageal reflux disease)    Osteoporosis     Family History: Family History  Problem Relation Age of Onset   Heart disease Mother    Hyperlipidemia Mother    Macular degeneration Mother    Glaucoma Mother    Hyperlipidemia Father    Aneurysm Father     Social History   Socioeconomic History   Marital status: Married    Spouse name: Not on file   Number of children: Not on file   Years of education: Not on file   Highest education level: Not on file  Occupational History   Not on file  Tobacco Use   Smoking status: Every Day    Current packs/day: 1.00    Average packs/day: 1 pack/day for 43.0 years (43.0 ttl pk-yrs)    Types: Cigarettes   Smokeless tobacco: Never   Tobacco comments:    pt smoking 10 a day 02/25/21  Vaping Use   Vaping status: Never Used  Substance and Sexual Activity   Alcohol use: Not Currently   Drug use: Not Currently   Sexual activity: Not on  file  Other Topics Concern   Not on file  Social History Narrative   Not on file   Social Determinants of Health   Financial Resource Strain: Not on file  Food Insecurity: Not on file  Transportation Needs: Not on file  Physical Activity: Not on file  Stress: Not on file  Social Connections: Not on file  Intimate Partner Violence: Not on file      Review of Systems  Constitutional:  Negative for activity change, appetite change, chills, fatigue, fever and unexpected weight change.  HENT: Negative.  Negative for congestion, ear pain, rhinorrhea, sore throat and trouble swallowing.   Eyes: Negative.   Respiratory: Negative.  Negative  for cough, chest tightness, shortness of breath and wheezing.   Cardiovascular: Negative.  Negative for chest pain.  Gastrointestinal: Negative.  Negative for abdominal pain, blood in stool, constipation, diarrhea, nausea and vomiting.  Endocrine: Negative.   Genitourinary: Negative.  Negative for difficulty urinating, dysuria, frequency, hematuria and urgency.  Musculoskeletal: Negative.  Negative for arthralgias, back pain, joint swelling, myalgias and neck pain.  Skin: Negative.  Negative for rash and wound.  Allergic/Immunologic: Negative.  Negative for immunocompromised state.  Neurological: Negative.  Negative for dizziness, seizures, numbness and headaches.  Hematological: Negative.   Psychiatric/Behavioral:  Positive for behavioral problems and sleep disturbance. Negative for self-injury and suicidal ideas. The patient is nervous/anxious.     Vital Signs: BP 120/84   Pulse 64   Temp 98.1 F (36.7 C)   Resp 16   Ht 5\' 2"  (1.575 m)   Wt 147 lb 3.2 oz (66.8 kg)   SpO2 94%   BMI 26.92 kg/m    Physical Exam Vitals reviewed.  Constitutional:      General: She is awake. She is not in acute distress.    Appearance: Normal appearance. She is well-developed, well-groomed and normal weight. She is not ill-appearing or diaphoretic.  HENT:     Head: Normocephalic and atraumatic.     Right Ear: Tympanic membrane, ear canal and external ear normal.     Left Ear: Tympanic membrane, ear canal and external ear normal.     Nose: Nose normal. No congestion or rhinorrhea.     Mouth/Throat:     Lips: Pink.     Mouth: Mucous membranes are moist.     Pharynx: Oropharynx is clear. Uvula midline. No oropharyngeal exudate or posterior oropharyngeal erythema.  Eyes:     General: Lids are normal. Vision grossly intact. Gaze aligned appropriately. No scleral icterus.       Right eye: No discharge.        Left eye: No discharge.     Extraocular Movements: Extraocular movements intact.      Conjunctiva/sclera: Conjunctivae normal.     Pupils: Pupils are equal, round, and reactive to light.     Funduscopic exam:    Right eye: Red reflex present.        Left eye: Red reflex present. Neck:     Thyroid: No thyromegaly.     Vascular: No JVD.     Trachea: Trachea and phonation normal. No tracheal deviation.  Cardiovascular:     Rate and Rhythm: Normal rate and regular rhythm.     Heart sounds: Normal heart sounds, S1 normal and S2 normal. No murmur heard.    No friction rub. No gallop.  Pulmonary:     Effort: Pulmonary effort is normal. No accessory muscle usage or respiratory distress.     Breath sounds:  Normal breath sounds and air entry. No stridor. No wheezing or rales.  Chest:     Chest wall: No tenderness.     Comments: Declined clinical breast exam, gets annual mammograms.  Abdominal:     General: Bowel sounds are normal. There is no distension.     Palpations: Abdomen is soft. There is no shifting dullness, fluid wave, mass or pulsatile mass.     Tenderness: There is no abdominal tenderness. There is no guarding or rebound.  Musculoskeletal:        General: No tenderness or deformity. Normal range of motion.     Cervical back: Normal range of motion and neck supple.  Lymphadenopathy:     Cervical: No cervical adenopathy.  Skin:    General: Skin is warm and dry.     Capillary Refill: Capillary refill takes less than 2 seconds.     Coloration: Skin is not pale.     Findings: No erythema or rash.  Neurological:     Mental Status: She is alert and oriented to person, place, and time.     Cranial Nerves: No cranial nerve deficit.     Motor: No abnormal muscle tone.     Coordination: Coordination normal.     Gait: Gait normal.     Deep Tendon Reflexes: Reflexes are normal and symmetric.  Psychiatric:        Mood and Affect: Mood is anxious and depressed. Affect is tearful.        Speech: Speech normal.        Behavior: Behavior normal. Behavior is cooperative.         Thought Content: Thought content normal.        Judgment: Judgment normal.        Assessment/Plan: 1. Encounter for routine adult health examination with abnormal findings Age-appropriate preventive screenings and vaccinations discussed, annual physical exam completed. Routine labs for health maintenance will be ordered, lipid panel. PHM updated.   2. Actinic keratosis Referred to dermatology - Ambulatory referral to Dermatology  3. Cyst on ear Referred to dermatology - Ambulatory referral to Dermatology  4. Generalized anxiety disorder Continue citalopram and alprazolam as prescribed. Follow up in 3 months for additional refills of alprazolam  - ALPRAZolam (XANAX) 0.5 MG tablet; Take 1 tablet (0.5 mg total) by mouth 2 (two) times daily as needed for anxiety.  Dispense: 60 tablet; Refill: 2 - citalopram (CELEXA) 40 MG tablet; Take 0.5 tablets (20 mg total) by mouth 2 (two) times daily.  Dispense: 90 tablet; Refill: 1  5. Encounter for long-term (current) use of medications UDS positive for Benzos which is consistent with current prescriptions, repeat UDS in 6 months  - POCT Urine Drug Screen   General Counseling: Moriyah verbalizes understanding of the findings of todays visit and agrees with plan of treatment. I have discussed any further diagnostic evaluation that may be needed or ordered today. We also reviewed her medications today. she has been encouraged to call the office with any questions or concerns that should arise related to todays visit.    Orders Placed This Encounter  Procedures   Ambulatory referral to Dermatology   POCT Urine Drug Screen    Meds ordered this encounter  Medications   ALPRAZolam (XANAX) 0.5 MG tablet    Sig: Take 1 tablet (0.5 mg total) by mouth 2 (two) times daily as needed for anxiety.    Dispense:  60 tablet    Refill:  2    PLEASE FILL  THIS MEDICATION NOW. ASAP. Pt tried to get med, told no, there should be 2 refills there,it  has  not been filled since 08/01/23. It is medically necessary for the patient, do not let her run out.   citalopram (CELEXA) 40 MG tablet    Sig: Take 0.5 tablets (20 mg total) by mouth 2 (two) times daily.    Dispense:  90 tablet    Refill:  1    FILL THIS MEDICATION NOW ASAP. Refills were sent in September, patient keeps getting told she cannot fill this med but has not had it filled in 2 months or more. Please fill now, do not let her run out.    Return in about 3 months (around 01/04/2024) for F/U, anxiety med refill, Kitrina Maurin PCP.   Total time spent:30 Minutes Time spent includes review of chart, medications, test results, and follow up plan with the patient.   [place] Controlled Substance Database was reviewed by me.  This patient was seen by Sallyanne Kuster, FNP-C in collaboration with Dr. Beverely Risen as a part of collaborative care agreement.  Lindsey Demonte R. Tedd Sias, MSN, FNP-C Internal medicine

## 2023-10-27 ENCOUNTER — Other Ambulatory Visit: Payer: Self-pay | Admitting: Nurse Practitioner

## 2023-10-27 DIAGNOSIS — E782 Mixed hyperlipidemia: Secondary | ICD-10-CM | POA: Diagnosis not present

## 2023-10-28 LAB — LIPID PANEL
Chol/HDL Ratio: 3.8 {ratio} (ref 0.0–4.4)
Cholesterol, Total: 226 mg/dL — ABNORMAL HIGH (ref 100–199)
HDL: 60 mg/dL (ref >39)
LDL Chol Calc (NIH): 143 mg/dL — ABNORMAL HIGH (ref 0–99)
Triglycerides: 128 mg/dL (ref 0–149)
VLDL Cholesterol Cal: 23 mg/dL (ref 5–40)

## 2023-10-30 ENCOUNTER — Ambulatory Visit
Admission: RE | Admit: 2023-10-30 | Discharge: 2023-10-30 | Disposition: A | Discharge status: Home / Self Care | Payer: 59 | Source: Ambulatory Visit | Attending: Nurse Practitioner | Admitting: Nurse Practitioner

## 2023-10-30 DIAGNOSIS — Z1231 Encounter for screening mammogram for malignant neoplasm of breast: Secondary | ICD-10-CM | POA: Insufficient documentation

## 2023-11-01 ENCOUNTER — Other Ambulatory Visit: Payer: Self-pay | Admitting: Nurse Practitioner

## 2023-11-01 DIAGNOSIS — K219 Gastro-esophageal reflux disease without esophagitis: Secondary | ICD-10-CM

## 2023-11-02 DIAGNOSIS — L821 Other seborrheic keratosis: Secondary | ICD-10-CM | POA: Diagnosis not present

## 2023-11-02 DIAGNOSIS — L720 Epidermal cyst: Secondary | ICD-10-CM | POA: Diagnosis not present

## 2023-11-06 ENCOUNTER — Other Ambulatory Visit: Payer: Self-pay | Admitting: Internal Medicine

## 2023-11-06 DIAGNOSIS — R062 Wheezing: Secondary | ICD-10-CM

## 2023-11-09 NOTE — Progress Notes (Signed)
Cholesterol levels are about the same as on her last cholesterol labs. Adding a fish oil supplement will help if she has not already. If she already added a fish oil supplement then we may need to add a statin or fenofibrate to get the cholesterol level to come down.

## 2023-11-10 ENCOUNTER — Telehealth: Payer: Self-pay

## 2023-11-10 NOTE — Telephone Encounter (Signed)
Left message for patient to give office a call back

## 2023-11-19 ENCOUNTER — Other Ambulatory Visit: Payer: Self-pay | Admitting: Acute Care

## 2023-11-19 DIAGNOSIS — F17210 Nicotine dependence, cigarettes, uncomplicated: Secondary | ICD-10-CM

## 2023-11-19 DIAGNOSIS — Z87891 Personal history of nicotine dependence: Secondary | ICD-10-CM

## 2023-11-19 DIAGNOSIS — Z122 Encounter for screening for malignant neoplasm of respiratory organs: Secondary | ICD-10-CM

## 2023-11-27 DIAGNOSIS — L720 Epidermal cyst: Secondary | ICD-10-CM | POA: Diagnosis not present

## 2023-12-01 ENCOUNTER — Other Ambulatory Visit: Payer: Self-pay | Admitting: Nurse Practitioner

## 2023-12-01 DIAGNOSIS — F411 Generalized anxiety disorder: Secondary | ICD-10-CM

## 2023-12-19 ENCOUNTER — Telehealth: Payer: Self-pay | Admitting: Nurse Practitioner

## 2023-12-19 DIAGNOSIS — J9801 Acute bronchospasm: Secondary | ICD-10-CM | POA: Diagnosis not present

## 2023-12-19 DIAGNOSIS — Z03818 Encounter for observation for suspected exposure to other biological agents ruled out: Secondary | ICD-10-CM | POA: Diagnosis not present

## 2023-12-19 DIAGNOSIS — B9689 Other specified bacterial agents as the cause of diseases classified elsewhere: Secondary | ICD-10-CM | POA: Diagnosis not present

## 2023-12-19 DIAGNOSIS — J209 Acute bronchitis, unspecified: Secondary | ICD-10-CM | POA: Diagnosis not present

## 2023-12-19 DIAGNOSIS — B338 Other specified viral diseases: Secondary | ICD-10-CM | POA: Diagnosis not present

## 2023-12-19 DIAGNOSIS — J329 Chronic sinusitis, unspecified: Secondary | ICD-10-CM | POA: Diagnosis not present

## 2023-12-19 NOTE — Telephone Encounter (Signed)
Patient called requesting sick visit for cough, congestion, runny nose, etc, I encouraged her to go to Urgent care due to no available appointments until Thursday 12/21/23-Toni

## 2023-12-30 ENCOUNTER — Other Ambulatory Visit: Payer: Self-pay | Admitting: Nurse Practitioner

## 2023-12-30 DIAGNOSIS — I10 Essential (primary) hypertension: Secondary | ICD-10-CM

## 2024-01-09 ENCOUNTER — Telehealth: Payer: Self-pay | Admitting: Nurse Practitioner

## 2024-01-09 NOTE — Telephone Encounter (Signed)
 Lvm & sent mychart msg to change 01/11/24 appointment to virtual-Toni

## 2024-01-11 ENCOUNTER — Telehealth: Payer: Self-pay | Admitting: Nurse Practitioner

## 2024-01-11 ENCOUNTER — Encounter: Payer: Self-pay | Admitting: Nurse Practitioner

## 2024-01-11 ENCOUNTER — Telehealth (INDEPENDENT_AMBULATORY_CARE_PROVIDER_SITE_OTHER): Payer: 59 | Admitting: Nurse Practitioner

## 2024-01-11 VITALS — Ht 62.0 in | Wt 147.0 lb

## 2024-01-11 DIAGNOSIS — Z1212 Encounter for screening for malignant neoplasm of rectum: Secondary | ICD-10-CM

## 2024-01-11 DIAGNOSIS — M810 Age-related osteoporosis without current pathological fracture: Secondary | ICD-10-CM | POA: Diagnosis not present

## 2024-01-11 DIAGNOSIS — I10 Essential (primary) hypertension: Secondary | ICD-10-CM | POA: Diagnosis not present

## 2024-01-11 DIAGNOSIS — R062 Wheezing: Secondary | ICD-10-CM

## 2024-01-11 DIAGNOSIS — Z1211 Encounter for screening for malignant neoplasm of colon: Secondary | ICD-10-CM

## 2024-01-11 DIAGNOSIS — F411 Generalized anxiety disorder: Secondary | ICD-10-CM | POA: Diagnosis not present

## 2024-01-11 MED ORDER — AMLODIPINE BESYLATE 5 MG PO TABS: 5.0000 mg | ORAL_TABLET | Freq: Every day | ORAL | 0 refills | Status: DC

## 2024-01-11 MED ORDER — IPRATROPIUM-ALBUTEROL 0.5-2.5 (3) MG/3ML IN SOLN: 3.0000 mL | Freq: Four times a day (QID) | RESPIRATORY_TRACT | 3 refills | Status: AC | PRN

## 2024-01-11 MED ORDER — ALPRAZOLAM 0.5 MG PO TABS: 0.5000 mg | ORAL_TABLET | Freq: Two times a day (BID) | ORAL | 2 refills | Status: DC | PRN

## 2024-01-11 MED ORDER — CITALOPRAM HYDROBROMIDE 40 MG PO TABS: 20.0000 mg | ORAL_TABLET | Freq: Two times a day (BID) | ORAL | 0 refills | Status: DC

## 2024-01-11 MED ORDER — ALENDRONATE SODIUM 70 MG PO TABS: 70.0000 mg | ORAL_TABLET | ORAL | 4 refills | Status: AC

## 2024-01-11 NOTE — Progress Notes (Signed)
 [facility] [address] [place], [place] [address]  Internal MEDICINE  Telephone Visit  Patient Name: Felicia Blackwell  [id number]  [id number]  Date of Service: 01/11/2024  I connected with the patient at 1120 by telephone and verified the patients identity using two identifiers.   I discussed the limitations, risks, security and privacy concerns of performing an evaluation and management service by telephone and the availability of in person appointments. I also discussed with the patient that there may be a patient responsible charge related to the service.  The patient expressed understanding and agrees to proceed.    Chief Complaint  Patient presents with   Telephone Screen    Med  follow up   Telephone Assessment    [phone]    Depression    Anxiety    Cough    Depression        Associated symptoms include no fatigue and no suicidal ideas. Cough Pertinent negatives include no chest pain, chills, eye redness, rash, rhinorrhea, sore throat, shortness of breath or wheezing.   Katena presents for a telehealth virtual visit for anxiety, depression, cough and medication refills  Last seen in office for AWV in November 2024.  Referred to dermatology for keratosis and a cyst on her ear. She was seen in December by dermatology.  Anxiety -- taking celexa daily and alprazolam as needed. Doing well. Due for refills. UDS was done in November.  Mammogram was done in December last year -- result was BI-RADS category 1 negative.  Has a CT chest lung cancer screening scheduled for next Monday.  Recent RSV infection per urgent care notes, still experiencing a cough and some sinus drainage.  Repeat lipid panel showed no significant improvement in cholesterol levels.  Overdue for colonoscopy now. Last done in 2014.   Current Medication: Outpatient Encounter Medications as of 01/11/2024  Medication Sig   albuterol (VENTOLIN HFA) 108 (90 Base) MCG/ACT inhaler TAKE 2 PUFFS BY  MOUTH EVERY 6 HOURS AS NEEDED FOR WHEEZE OR SHORTNESS OF BREATH   famotidine (PEPCID) 20 MG tablet TAKE 1 TABLET BY MOUTH TWICE A DAY   levocetirizine (XYZAL) 5 MG tablet Take 1 tablet (5 mg total) by mouth every evening.   Multiple Vitamins-Minerals (MULTIVITAMIN ADULT PO) Take by mouth daily.   [DISCONTINUED] alendronate (FOSAMAX) 70 MG tablet Take 1 tablet (70 mg total) by mouth once a week. Take with a full glass of water on an empty stomach.   [DISCONTINUED] ALPRAZolam (XANAX) 0.5 MG tablet Take 1 tablet (0.5 mg total) by mouth 2 (two) times daily as needed for anxiety.   [DISCONTINUED] amLODipine (NORVASC) 5 MG tablet TAKE 1 TABLET (5 MG TOTAL) BY MOUTH DAILY.   [DISCONTINUED] citalopram (CELEXA) 40 MG tablet TAKE 0.5 TABLETS (20 MG TOTAL) BY MOUTH 2 (TWO) TIMES DAILY.   [DISCONTINUED] ipratropium-albuterol (DUONEB) 0.5-2.5 (3) MG/3ML SOLN Take 3 mLs by nebulization every 6 (six) hours as needed.   alendronate (FOSAMAX) 70 MG tablet Take 1 tablet (70 mg total) by mouth once a week. Take with a full glass of water on an empty stomach.   ALPRAZolam (XANAX) 0.5 MG tablet Take 1 tablet (0.5 mg total) by mouth 2 (two) times daily as needed for anxiety.   amLODipine (NORVASC) 5 MG tablet Take 1 tablet (5 mg total) by mouth daily.   citalopram (CELEXA) 40 MG tablet Take 0.5 tablets (20 mg total) by mouth 2 (two) times daily.   ipratropium-albuterol (DUONEB) 0.5-2.5 (3) MG/3ML SOLN Take  3 mLs by nebulization every 6 (six) hours as needed.   No facility-administered encounter medications on file as of 01/11/2024.    Surgical History: Past Surgical History:  Procedure Laterality Date   TUBAL LIGATION      Medical History: Past Medical History:  Diagnosis Date   Anxiety    Depression    GERD (gastroesophageal reflux disease)    Osteoporosis     Family History: Family History  Problem Relation Age of Onset   Heart disease Mother    Hyperlipidemia Mother    Macular degeneration Mother     Glaucoma Mother    Hyperlipidemia Father    Aneurysm Father     Social History   Socioeconomic History   Marital status: Married    Spouse name: Not on file   Number of children: Not on file   Years of education: Not on file   Highest education level: Not on file  Occupational History   Not on file  Tobacco Use   Smoking status: Every Day    Current packs/day: 1.00    Average packs/day: 1 pack/day for 43.0 years (43.0 ttl pk-yrs)    Types: Cigarettes   Smokeless tobacco: Never   Tobacco comments:    pt smoking 10 a day 02/25/21  Vaping Use   Vaping status: Never Used  Substance and Sexual Activity   Alcohol use: Not Currently   Drug use: Not Currently   Sexual activity: Not on file  Other Topics Concern   Not on file  Social History Narrative   Not on file   Social Drivers of Health   Financial Resource Strain: Not on file  Food Insecurity: Not on file  Transportation Needs: Not on file  Physical Activity: Not on file  Stress: Not on file  Social Connections: Not on file  Intimate Partner Violence: Not on file      Review of Systems  Constitutional:  Negative for chills, fatigue and unexpected weight change.  HENT:  Negative for congestion, rhinorrhea, sneezing and sore throat.   Eyes:  Negative for redness.  Respiratory:  Positive for cough. Negative for chest tightness, shortness of breath and wheezing.   Cardiovascular: Negative.  Negative for chest pain and palpitations.  Gastrointestinal:  Positive for constipation and nausea. Negative for abdominal pain, diarrhea and vomiting.  Genitourinary:  Negative for dysuria and frequency.  Musculoskeletal:  Negative for arthralgias, back pain, joint swelling and neck pain.  Skin:  Negative for rash.  Neurological: Negative.  Negative for tremors and numbness.  Hematological:  Negative for adenopathy. Does not bruise/bleed easily.  Psychiatric/Behavioral:  Positive for depression and sleep disturbance. Negative  for behavioral problems (Depression), self-injury and suicidal ideas. The patient is nervous/anxious.     Vital Signs: Ht 5\' 2"  (1.575 m)   Wt 147 lb (66.7 kg)   BMI 26.89 kg/m    Observation/Objective: She is alert and oriented. No acute distress noted.     Assessment/Plan: 1. Essential hypertension (Primary) Stable, continue amlodipine as prescribed.  - amLODipine (NORVASC) 5 MG tablet; Take 1 tablet (5 mg total) by mouth daily.  Dispense: 90 tablet; Refill: 0  2. Wheezing May use duoneb treatments as needed for wheezing and SOB - ipratropium-albuterol (DUONEB) 0.5-2.5 (3) MG/3ML SOLN; Take 3 mLs by nebulization every 6 (six) hours as needed.  Dispense: 120 mL; Refill: 3  3. Age-related osteoporosis without current pathological fracture Continue alendronate as prescribed.  - alendronate (FOSAMAX) 70 MG tablet; Take 1 tablet (70  mg total) by mouth once a week. Take with a full glass of water on an empty stomach.  Dispense: 12 tablet; Refill: 4  4. Screening for colorectal cancer Referred to GI - Ambulatory referral to Gastroenterology  5. Generalized anxiety disorder Continue celexa as prescribed. Continue prn alprazolam as prescribed. Follow up in 3 months for additional refills. UDS due at next office visit  - citalopram (CELEXA) 40 MG tablet; Take 0.5 tablets (20 mg total) by mouth 2 (two) times daily.  Dispense: 90 tablet; Refill: 0 - ALPRAZolam (XANAX) 0.5 MG tablet; Take 1 tablet (0.5 mg total) by mouth 2 (two) times daily as needed for anxiety.  Dispense: 60 tablet; Refill: 2   General Counseling: Nick verbalizes understanding of the findings of today's phone visit and agrees with plan of treatment. I have discussed any further diagnostic evaluation that may be needed or ordered today. We also reviewed her medications today. she has been encouraged to call the office with any questions or concerns that should arise related to todays visit.  Return in about 3 months  (around 04/02/2024) for F/U, anxiety med refill, Calypso Hagarty PCP, UDS due at next visit. .   Orders Placed This Encounter  Procedures   Ambulatory referral to Gastroenterology    Meds ordered this encounter  Medications   alendronate (FOSAMAX) 70 MG tablet    Sig: Take 1 tablet (70 mg total) by mouth once a week. Take with a full glass of water on an empty stomach.    Dispense:  12 tablet    Refill:  4   citalopram (CELEXA) 40 MG tablet    Sig: Take 0.5 tablets (20 mg total) by mouth 2 (two) times daily.    Dispense:  90 tablet    Refill:  0   amLODipine (NORVASC) 5 MG tablet    Sig: Take 1 tablet (5 mg total) by mouth daily.    Dispense:  90 tablet    Refill:  0   ALPRAZolam (XANAX) 0.5 MG tablet    Sig: Take 1 tablet (0.5 mg total) by mouth 2 (two) times daily as needed for anxiety.    Dispense:  60 tablet    Refill:  2    For future refills. It is medically necessary for the patient, do not let her run out.   ipratropium-albuterol (DUONEB) 0.5-2.5 (3) MG/3ML SOLN    Sig: Take 3 mLs by nebulization every 6 (six) hours as needed.    Dispense:  120 mL    Refill:  3    For future refills, do not fill until patient calls to request this medication    Time spent:20 Minutes Time spent with patient included reviewing progress notes, labs, imaging studies, and discussing plan for follow up.  [place] Controlled Substance Database was reviewed by me for overdose risk score (ORS) if appropriate.  This patient was seen by Sallyanne Kuster, FNP-C in collaboration with Dr. Beverely Risen as a part of collaborative care agreement.  Nyree Yonker R. Tedd Sias, MSN, FNP-C Internal medicine

## 2024-01-11 NOTE — Telephone Encounter (Signed)
 GI referral sent via Epic to [facility]. Notified patient. Gave pt telephone # [phone]

## 2024-01-15 ENCOUNTER — Ambulatory Visit
Admission: RE | Admit: 2024-01-15 | Discharge: 2024-01-15 | Disposition: A | Discharge status: Home / Self Care | Payer: 59 | Source: Ambulatory Visit | Attending: Acute Care | Admitting: Acute Care

## 2024-01-15 DIAGNOSIS — Z87891 Personal history of nicotine dependence: Secondary | ICD-10-CM

## 2024-01-15 DIAGNOSIS — Z122 Encounter for screening for malignant neoplasm of respiratory organs: Secondary | ICD-10-CM

## 2024-01-15 DIAGNOSIS — F17210 Nicotine dependence, cigarettes, uncomplicated: Secondary | ICD-10-CM

## 2024-01-16 ENCOUNTER — Telehealth: Payer: Self-pay

## 2024-01-16 ENCOUNTER — Other Ambulatory Visit: Payer: Self-pay

## 2024-01-16 DIAGNOSIS — Z1211 Encounter for screening for malignant neoplasm of colon: Secondary | ICD-10-CM

## 2024-01-16 MED ORDER — NA SULFATE-K SULFATE-MG SULF 17.5-3.13-1.6 GM/177ML PO SOLN: 1.0000 | Freq: Once | ORAL | 0 refills | Status: AC

## 2024-01-16 NOTE — Telephone Encounter (Signed)
 Gastroenterology Pre-Procedure Review  Request Date: 03/04/24 Requesting Physician: Dr. Tobi Bastos  PATIENT REVIEW QUESTIONS: The patient responded to the following health history questions as indicated:    1. Are you having any GI issues? no 2. Do you have a personal history of Polyps? no 3. Do you have a family history of Colon Cancer or Polyps? no 4. Diabetes Mellitus? no 5. Joint replacements in the past 12 months?no 6. Major health problems in the past 3 months?no 7. Any artificial heart valves, MVP, or defibrillator?no    MEDICATIONS & ALLERGIES:    Patient reports the following regarding taking any anticoagulation/antiplatelet therapy:   Plavix, Coumadin, Eliquis, Xarelto, Lovenox, Pradaxa, Brilinta, or Effient? no Aspirin? no  Patient confirms/reports the following medications:  Current Outpatient Medications  Medication Sig Dispense Refill   albuterol (VENTOLIN HFA) 108 (90 Base) MCG/ACT inhaler TAKE 2 PUFFS BY MOUTH EVERY 6 HOURS AS NEEDED FOR WHEEZE OR SHORTNESS OF BREATH 18 each 3   alendronate (FOSAMAX) 70 MG tablet Take 1 tablet (70 mg total) by mouth once a week. Take with a full glass of water on an empty stomach. 12 tablet 4   ALPRAZolam (XANAX) 0.5 MG tablet Take 1 tablet (0.5 mg total) by mouth 2 (two) times daily as needed for anxiety. 60 tablet 2   amLODipine (NORVASC) 5 MG tablet Take 1 tablet (5 mg total) by mouth daily. 90 tablet 0   citalopram (CELEXA) 40 MG tablet Take 0.5 tablets (20 mg total) by mouth 2 (two) times daily. 90 tablet 0   famotidine (PEPCID) 20 MG tablet TAKE 1 TABLET BY MOUTH TWICE A DAY 180 tablet 2   ipratropium-albuterol (DUONEB) 0.5-2.5 (3) MG/3ML SOLN Take 3 mLs by nebulization every 6 (six) hours as needed. 120 mL 3   levocetirizine (XYZAL) 5 MG tablet Take 1 tablet (5 mg total) by mouth every evening. 30 tablet 2   Multiple Vitamins-Minerals (MULTIVITAMIN ADULT PO) Take by mouth daily.     No current facility-administered medications for  this visit.    Patient confirms/reports the following allergies:  Allergies  Allergen Reactions   Cat Dander Shortness Of Breath   Codeine Hives, Itching and Nausea Only   Dust Mite Extract Shortness Of Breath    No orders of the defined types were placed in this encounter.   AUTHORIZATION INFORMATION Primary Insurance: 1D#: Group #:  Secondary Insurance: 1D#: Group #:  SCHEDULE INFORMATION: Date: 03/04/24 Time: Location: [facility]

## 2024-01-25 ENCOUNTER — Ambulatory Visit: Payer: 59 | Admitting: Dermatology

## 2024-01-30 ENCOUNTER — Other Ambulatory Visit: Payer: Self-pay

## 2024-01-30 DIAGNOSIS — Z122 Encounter for screening for malignant neoplasm of respiratory organs: Secondary | ICD-10-CM

## 2024-01-30 DIAGNOSIS — F17210 Nicotine dependence, cigarettes, uncomplicated: Secondary | ICD-10-CM

## 2024-01-30 DIAGNOSIS — Z87891 Personal history of nicotine dependence: Secondary | ICD-10-CM

## 2024-02-26 ENCOUNTER — Encounter: Payer: Self-pay | Admitting: Gastroenterology

## 2024-02-29 ENCOUNTER — Telehealth: Payer: Self-pay

## 2024-02-29 NOTE — Telephone Encounter (Signed)
 Pt contacted office stated she did not have her instructions yet.  Requested it to be texted to her and emailed to her husband's email at [email or web address].  Test message sent to patient.  Instructions sent to husband's email.  Thanks, [place], [place]

## 2024-03-04 ENCOUNTER — Encounter
Admission: RE | Disposition: A | Discharge status: Home / Self Care | Payer: Self-pay | Source: Home / Self Care | Attending: Gastroenterology

## 2024-03-04 ENCOUNTER — Encounter: Payer: Self-pay | Admitting: Gastroenterology

## 2024-03-04 ENCOUNTER — Ambulatory Visit
Admission: RE | Admit: 2024-03-04 | Discharge: 2024-03-04 | Disposition: A | Discharge status: Home / Self Care | Payer: 59 | Attending: Gastroenterology | Admitting: Gastroenterology

## 2024-03-04 ENCOUNTER — Ambulatory Visit: Admitting: Certified Registered"

## 2024-03-04 DIAGNOSIS — K635 Polyp of colon: Secondary | ICD-10-CM | POA: Insufficient documentation

## 2024-03-04 DIAGNOSIS — D126 Benign neoplasm of colon, unspecified: Secondary | ICD-10-CM

## 2024-03-04 DIAGNOSIS — F419 Anxiety disorder, unspecified: Secondary | ICD-10-CM | POA: Diagnosis not present

## 2024-03-04 DIAGNOSIS — F17210 Nicotine dependence, cigarettes, uncomplicated: Secondary | ICD-10-CM | POA: Insufficient documentation

## 2024-03-04 DIAGNOSIS — K640 First degree hemorrhoids: Secondary | ICD-10-CM | POA: Diagnosis not present

## 2024-03-04 DIAGNOSIS — J449 Chronic obstructive pulmonary disease, unspecified: Secondary | ICD-10-CM | POA: Diagnosis not present

## 2024-03-04 DIAGNOSIS — F32A Depression, unspecified: Secondary | ICD-10-CM | POA: Diagnosis not present

## 2024-03-04 DIAGNOSIS — Z1211 Encounter for screening for malignant neoplasm of colon: Secondary | ICD-10-CM

## 2024-03-04 HISTORY — PX: COLONOSCOPY WITH PROPOFOL: SHX5780

## 2024-03-04 HISTORY — PX: POLYPECTOMY: SHX149

## 2024-03-04 SURGERY — COLONOSCOPY WITH PROPOFOL
Anesthesia: General

## 2024-03-04 MED ORDER — SODIUM CHLORIDE 0.9 % IV SOLN
INTRAVENOUS | Status: DC
  Administered 2024-03-04: 20 mL/h via INTRAVENOUS

## 2024-03-04 MED ORDER — LIDOCAINE HCL (CARDIAC) PF 100 MG/5ML IV SOSY
PREFILLED_SYRINGE | INTRAVENOUS | Status: DC | PRN
  Administered 2024-03-04: 100 mg via INTRAVENOUS

## 2024-03-04 MED ORDER — STERILE WATER FOR IRRIGATION IR SOLN
Status: DC | PRN
  Administered 2024-03-04 (×2): 60 mL

## 2024-03-04 MED ORDER — PROPOFOL 10 MG/ML IV BOLUS
INTRAVENOUS | Status: DC | PRN
  Administered 2024-03-04: 60 mg via INTRAVENOUS

## 2024-03-04 MED ORDER — PROPOFOL 500 MG/50ML IV EMUL
INTRAVENOUS | Status: DC | PRN
  Administered 2024-03-04: 155 ug/kg/min via INTRAVENOUS

## 2024-03-04 MED ORDER — GLYCOPYRROLATE 0.2 MG/ML IJ SOLN
INTRAMUSCULAR | Status: DC | PRN
  Administered 2024-03-04: .2 mg via INTRAVENOUS

## 2024-03-04 NOTE — Transfer of Care (Signed)
 Immediate Anesthesia Transfer of Care Note  Patient: Felicia Blackwell  Procedure(s) Performed: COLONOSCOPY WITH PROPOFOL POLYPECTOMY, INTESTINE  Patient Location: Endoscopy Unit  Anesthesia Type:General  Level of Consciousness: drowsy and patient cooperative  Airway & Oxygen Therapy: Patient Spontanous Breathing and Patient connected to face mask oxygen  Post-op Assessment: Report given to RN and Post -op Vital signs reviewed and stable  Post vital signs: Reviewed and stable  Last Vitals:  Vitals Value Taken Time  BP 104/51 03/04/24 0955  Temp 35.8 C 03/04/24 0955  Pulse 59 03/04/24 0959  Resp 17 03/04/24 0959  SpO2 100 % 03/04/24 0959  Vitals shown include unfiled device data.  Last Pain:  Vitals:   03/04/24 0955  TempSrc: Tympanic  PainSc: Asleep         Complications: No notable events documented.

## 2024-03-04 NOTE — Anesthesia Postprocedure Evaluation (Signed)
 Anesthesia Post Note  Patient: Felicia Blackwell  Procedure(s) Performed: COLONOSCOPY WITH PROPOFOL POLYPECTOMY, INTESTINE  Patient location during evaluation: Endoscopy Anesthesia Type: General Level of consciousness: awake and alert Pain management: pain level controlled Vital Signs Assessment: post-procedure vital signs reviewed and stable Respiratory status: spontaneous breathing, nonlabored ventilation, respiratory function stable and patient connected to nasal cannula oxygen Cardiovascular status: blood pressure returned to baseline and stable Postop Assessment: no apparent nausea or vomiting Anesthetic complications: no  No notable events documented.   Last Vitals:  Vitals:   03/04/24 1005 03/04/24 1015  BP: (!) 89/53 104/60  Pulse: 64 66  Resp: 18 20  Temp:    SpO2: 97% 97%    Last Pain:  Vitals:   03/04/24 1015  TempSrc:   PainSc: 0-No pain                 Enrique Harvest

## 2024-03-04 NOTE — Op Note (Signed)
 [facility] Gastroenterology Patient Name: Felicia Blackwell Procedure Date: 03/04/2024 9:18 AM MRN: [id number] Account #: [id number] Date of Birth: Mar 30, 1956 Admit Type: Outpatient Age: 68 Room: [facility] ENDO ROOM 1 Gender: Female Note Status: Finalized Instrument Name: Charlyn Cooley [id number] Procedure:             Colonoscopy Indications:           Screening for colorectal malignant neoplasm Providers:             Luke Salaam MD, MD Referring MD:          Laurence Pons (Referring MD) Medicines:             Monitored Anesthesia Care Complications:         No immediate complications. Procedure:             Pre-Anesthesia Assessment:                        - Prior to the procedure, a History and Physical was                         performed, and patient medications, allergies and                         sensitivities were reviewed. The patient's tolerance                         of previous anesthesia was reviewed.                        - The risks and benefits of the procedure and the                         sedation options and risks were discussed with the                         patient. All questions were answered and informed                         consent was obtained.                        - ASA Grade Assessment: II - A patient with mild                         systemic disease.                        After obtaining informed consent, the colonoscope was                         passed under direct vision. Throughout the procedure,                         the patient's blood pressure, pulse, and oxygen                         saturations were monitored continuously. The                         Colonoscope was introduced through  the anus and                         advanced to the the cecum, identified by the                         appendiceal orifice. The colonoscopy was performed                         with ease. The patient tolerated the procedure well.                          The quality of the bowel preparation was excellent.                         The ileocecal valve, appendiceal orifice, and rectum                         were photographed. Findings:      The perianal and digital rectal examinations were normal.      An 8 mm polyp was found in the sigmoid colon. The polyp was sessile. The       polyp was removed with a cold snare. Resection and retrieval were       complete.      Non-bleeding internal hemorrhoids were found during retroflexion. The       hemorrhoids were medium-sized and Grade I (internal hemorrhoids that do       not prolapse).      The exam was otherwise without abnormality on direct and retroflexion       views. Impression:            - One 8 mm polyp in the sigmoid colon, removed with a                         cold snare. Resected and retrieved.                        - Non-bleeding internal hemorrhoids.                        - The examination was otherwise normal on direct and                         retroflexion views. Recommendation:        - Discharge patient to home (with escort).                        - Resume previous diet.                        - Continue present medications.                        - Await pathology results.                        - Repeat colonoscopy for surveillance based on                         pathology results. Procedure Code(s):     ---  Professional ---                        [phone], Colonoscopy, flexible; with removal of                         tumor(s), polyp(s), or other lesion(s) by snare                         technique Diagnosis Code(s):     --- Professional ---                        Z12.11, Encounter for screening for malignant neoplasm                         of colon                        D12.5, Benign neoplasm of sigmoid colon                        K64.0, First degree hemorrhoids CPT copyright 2022 American Medical Association. All rights reserved. The codes  documented in this report are preliminary and upon coder review may  be revised to meet current compliance requirements. Luke Salaam, MD Luke Salaam MD, MD 03/04/2024 9:54:58 AM This report has been signed electronically. Number of Addenda: 0 Note Initiated On: 03/04/2024 9:18 AM Scope Withdrawal Time: 0 hours 8 minutes 51 seconds  Total Procedure Duration: 0 hours 12 minutes 20 seconds  Estimated Blood Loss:  Estimated blood loss: none.      [facility]

## 2024-03-04 NOTE — H&P (Signed)
 Felicia Mood, MD [address], [place], [place], [address] [address], [address], [place], [place], [address] Phone: [phone]  Fax: [phone]  Primary Care Physician:  Sallyanne Kuster, NP   Pre-Procedure History & Physical: HPI:  Felicia Blackwell is a 68 y.o. female is here for an colonoscopy.   Past Medical History:  Diagnosis Date   Anxiety    Depression    GERD (gastroesophageal reflux disease)    Osteoporosis     Past Surgical History:  Procedure Laterality Date   TUBAL LIGATION      Prior to Admission medications   Medication Sig Start Date End Date Taking? Authorizing Provider  albuterol (VENTOLIN HFA) 108 (90 Base) MCG/ACT inhaler TAKE 2 PUFFS BY MOUTH EVERY 6 HOURS AS NEEDED FOR WHEEZE OR SHORTNESS OF BREATH 11/06/23   Sallyanne Kuster, NP  alendronate (FOSAMAX) 70 MG tablet Take 1 tablet (70 mg total) by mouth once a week. Take with a full glass of water on an empty stomach. 01/11/24   Sallyanne Kuster, NP  ALPRAZolam Prudy Feeler) 0.5 MG tablet Take 1 tablet (0.5 mg total) by mouth 2 (two) times daily as needed for anxiety. 01/11/24   Sallyanne Kuster, NP  amLODipine (NORVASC) 5 MG tablet Take 1 tablet (5 mg total) by mouth daily. 01/11/24   Sallyanne Kuster, NP  citalopram (CELEXA) 40 MG tablet Take 0.5 tablets (20 mg total) by mouth 2 (two) times daily. 01/11/24   Sallyanne Kuster, NP  famotidine (PEPCID) 20 MG tablet TAKE 1 TABLET BY MOUTH TWICE A DAY 11/01/23   Abernathy, Alyssa, NP  ipratropium-albuterol (DUONEB) 0.5-2.5 (3) MG/3ML SOLN Take 3 mLs by nebulization every 6 (six) hours as needed. 01/11/24   Sallyanne Kuster, NP  levocetirizine (XYZAL) 5 MG tablet Take 1 tablet (5 mg total) by mouth every evening. 08/24/22   Sallyanne Kuster, NP  Multiple Vitamins-Minerals (MULTIVITAMIN ADULT PO) Take by mouth daily.    [provider]    Allergies as of 01/16/2024 - Review Complete 01/11/2024  Allergen Reaction Noted   Cat dander  Shortness Of Breath 05/01/2017   Codeine Hives, Itching, and Nausea Only 10/21/2016   Dust mite extract Shortness Of Breath 05/01/2017    Family History  Problem Relation Age of Onset   Heart disease Mother    Hyperlipidemia Mother    Macular degeneration Mother    Glaucoma Mother    Hyperlipidemia Father    Aneurysm Father     Social History   Socioeconomic History   Marital status: Married    Spouse name: Not on file   Number of children: Not on file   Years of education: Not on file   Highest education level: Not on file  Occupational History   Not on file  Tobacco Use   Smoking status: Every Day    Current packs/day: 1.00    Average packs/day: 1 pack/day for 43.0 years (43.0 ttl pk-yrs)    Types: Cigarettes   Smokeless tobacco: Never   Tobacco comments:    pt smoking 10 a day 02/25/21  Vaping Use   Vaping status: Never Used  Substance and Sexual Activity   Alcohol use: Not Currently   Drug use: Not Currently   Sexual activity: Not on file  Other Topics Concern   Not on file  Social History Narrative   Not on file   Social Drivers of Health   Financial Resource Strain: Not on file  Food Insecurity: Not on file  Transportation Needs: Not on file  Physical Activity: Not on file  Stress: Not on file  Social Connections: Not on file  Intimate Partner Violence: Not on file    Review of Systems: See HPI, otherwise negative ROS  Physical Exam: There were no vitals taken for this visit. General:   Alert,  pleasant and cooperative in NAD Head:  Normocephalic and atraumatic. Neck:  Supple; no masses or thyromegaly. Lungs:  Clear throughout to auscultation, normal respiratory effort.    Heart:  +S1, +S2, Regular rate and rhythm, No edema. Abdomen:  Soft, nontender and nondistended. Normal bowel sounds, without guarding, and without rebound.   Neurologic:  Alert and  oriented x4;  grossly normal neurologically.  Impression/Plan: Felicia Blackwell is here for an  colonoscopy to be performed for Screening colonoscopy average risk   Risks, benefits, limitations, and alternatives regarding  colonoscopy have been reviewed with the patient.  Questions have been answered.  All parties agreeable.   Luke Salaam, MD  03/04/2024, 8:58 AM

## 2024-03-04 NOTE — Anesthesia Procedure Notes (Signed)
 Procedure Name: General with mask airway Date/Time: 03/04/2024 9:39 AM  Performed by: Niki Barter, CRNAPre-anesthesia Checklist: Patient identified, Emergency Drugs available, Suction available and Patient being monitored Patient Re-evaluated:Patient Re-evaluated prior to induction Oxygen Delivery Method: Simple face mask Induction Type: IV induction Placement Confirmation: positive ETCO2 and breath sounds checked- equal and bilateral Dental Injury: Teeth and Oropharynx as per pre-operative assessment

## 2024-03-04 NOTE — Anesthesia Preprocedure Evaluation (Signed)
 Anesthesia Evaluation  Patient identified by MRN, date of birth, ID band Patient awake    Reviewed: Allergy & Precautions, NPO status , Patient's Chart, lab work & pertinent test results  Airway Mallampati: III  TM Distance: >3 FB Neck ROM: full    Dental  (+) Chipped   Pulmonary COPD, Current Smoker and Patient abstained from smoking.   Pulmonary exam normal        Cardiovascular negative cardio ROS Normal cardiovascular exam     Neuro/Psych  PSYCHIATRIC DISORDERS Anxiety Depression    negative neurological ROS     GI/Hepatic negative GI ROS, Neg liver ROS,,,  Endo/Other  negative endocrine ROS    Renal/GU negative Renal ROS  negative genitourinary   Musculoskeletal   Abdominal   Peds  Hematology negative hematology ROS (+)   Anesthesia Other Findings Past Medical History: No date: Anxiety No date: Depression No date: GERD (gastroesophageal reflux disease) No date: Osteoporosis  Past Surgical History: No date: TUBAL LIGATION  BMI    Body Mass Index: 27.22 kg/m      Reproductive/Obstetrics negative OB ROS                             Anesthesia Physical Anesthesia Plan  ASA: 3  Anesthesia Plan: General   Post-op Pain Management: Minimal or no pain anticipated   Induction: Intravenous  PONV Risk Score and Plan: 3 and Propofol infusion, TIVA and Ondansetron  Airway Management Planned: Nasal Cannula  Additional Equipment: None  Intra-op Plan:   Post-operative Plan:   Informed Consent: I have reviewed the patients History and Physical, chart, labs and discussed the procedure including the risks, benefits and alternatives for the proposed anesthesia with the patient or authorized representative who has indicated his/her understanding and acceptance.     Dental advisory given  Plan Discussed with: CRNA and Surgeon  Anesthesia Plan Comments: (Discussed risks of  anesthesia with patient, including possibility of difficulty with spontaneous ventilation under anesthesia necessitating airway intervention, PONV, and rare risks such as cardiac or respiratory or neurological events, and allergic reactions. Discussed the role of CRNA in patient's perioperative care. Patient understands.)       Anesthesia Quick Evaluation

## 2024-03-05 LAB — SURGICAL PATHOLOGY

## 2024-03-06 ENCOUNTER — Other Ambulatory Visit: Payer: Self-pay | Admitting: Nurse Practitioner

## 2024-03-06 ENCOUNTER — Encounter: Payer: Self-pay | Admitting: Gastroenterology

## 2024-03-06 DIAGNOSIS — F411 Generalized anxiety disorder: Secondary | ICD-10-CM

## 2024-03-07 NOTE — Telephone Encounter (Signed)
 Please review and send

## 2024-04-02 ENCOUNTER — Ambulatory Visit (INDEPENDENT_AMBULATORY_CARE_PROVIDER_SITE_OTHER): Payer: 59 | Admitting: Nurse Practitioner

## 2024-04-02 ENCOUNTER — Encounter: Payer: Self-pay | Admitting: Nurse Practitioner

## 2024-04-02 VITALS — BP 135/80 | HR 66 | Temp 98.1°F | Resp 16 | Ht 62.0 in | Wt 146.0 lb

## 2024-04-02 DIAGNOSIS — I10 Essential (primary) hypertension: Secondary | ICD-10-CM

## 2024-04-02 DIAGNOSIS — Z79899 Other long term (current) drug therapy: Secondary | ICD-10-CM

## 2024-04-02 DIAGNOSIS — N2889 Other specified disorders of kidney and ureter: Secondary | ICD-10-CM | POA: Diagnosis not present

## 2024-04-02 DIAGNOSIS — M549 Dorsalgia, unspecified: Secondary | ICD-10-CM | POA: Diagnosis not present

## 2024-04-02 DIAGNOSIS — F411 Generalized anxiety disorder: Secondary | ICD-10-CM | POA: Diagnosis not present

## 2024-04-02 LAB — POCT URINE DRUG SCREEN
Methylenedioxyamphetamine: NOT DETECTED
POC Amphetamine UR: NOT DETECTED
POC BENZODIAZEPINES UR: NOT DETECTED
POC Barbiturate UR: NOT DETECTED
POC Cocaine UR: NOT DETECTED
POC Ecstasy UR: NOT DETECTED
POC Marijuana UR: NOT DETECTED
POC Methadone UR: NOT DETECTED
POC Methamphetamine UR: NOT DETECTED
POC Opiate Ur: NOT DETECTED
POC Oxycodone UR: NOT DETECTED
POC PHENCYCLIDINE UR: NOT DETECTED
POC TRICYCLICS UR: NOT DETECTED

## 2024-04-02 MED ORDER — ALPRAZOLAM 0.5 MG PO TABS: 0.5000 mg | ORAL_TABLET | Freq: Two times a day (BID) | ORAL | 2 refills | Status: DC | PRN

## 2024-04-02 MED ORDER — AMLODIPINE BESYLATE 5 MG PO TABS: 5.0000 mg | ORAL_TABLET | Freq: Every day | ORAL | 0 refills | Status: DC

## 2024-04-02 MED ORDER — METHOCARBAMOL 500 MG PO TABS: 500.0000 mg | ORAL_TABLET | Freq: Four times a day (QID) | ORAL | 2 refills | Status: DC | PRN

## 2024-04-02 MED ORDER — CITALOPRAM HYDROBROMIDE 40 MG PO TABS: 20.0000 mg | ORAL_TABLET | Freq: Two times a day (BID) | ORAL | 0 refills | Status: DC

## 2024-04-02 NOTE — Progress Notes (Signed)
 [facility] [address] [place], [place] [address]  Internal MEDICINE  Office Visit Note  Patient Name: Felicia Blackwell  [id number]  [id number]  Date of Service: 04/02/2024  Chief Complaint  Patient presents with   Depression   Gastroesophageal Reflux   Follow-up    HPI [name] presents for a follow-up visit for upper back pain, anxiety, refills, UDS and CT chest results.  Anxiety -- taking alprazolam  as needed. Has been trying to take less often now since her mother has passed. A lot of her anxiety was a result of being around her mother and caring for her mother due to the complicated relationship they had.  UDS due today Upper back pain -- onset was about 1.5 months ago. Has tried ibuprofen, heating pad, hot shower, and rest. Reports that this pain is pretty constant and aching.  CT chest was lung-rads 1 negative, incidental finding of aortic atherosclerosis and a moderate hiatal hernia.  Left kidney stone very small  Right kidney fluid density lesion, possibly a cyst that was incompletely imaged on the CT chest, needs to be further evaluated with imaging.    Current Medication: Outpatient Encounter Medications as of 04/02/2024  Medication Sig   albuterol  (VENTOLIN  HFA) 108 (90 Base) MCG/ACT inhaler TAKE 2 PUFFS BY MOUTH EVERY 6 HOURS AS NEEDED FOR WHEEZE OR SHORTNESS OF BREATH   alendronate  (FOSAMAX ) 70 MG tablet Take 1 tablet (70 mg total) by mouth once a week. Take with a full glass of water  on an empty stomach.   famotidine  (PEPCID ) 20 MG tablet TAKE 1 TABLET BY MOUTH TWICE A DAY   ipratropium-albuterol  (DUONEB) 0.5-2.5 (3) MG/3ML SOLN Take 3 mLs by nebulization every 6 (six) hours as needed.   levocetirizine (XYZAL ) 5 MG tablet Take 1 tablet (5 mg total) by mouth every evening.   methocarbamol  (ROBAXIN ) 500 MG tablet Take 1-2 tablets (500-1,000 mg total) by mouth every 6 (six) hours as needed for muscle spasms.   Multiple Vitamins-Minerals (MULTIVITAMIN ADULT PO)  Take by mouth daily.   [DISCONTINUED] ALPRAZolam  (XANAX ) 0.5 MG tablet Take 1 tablet (0.5 mg total) by mouth 2 (two) times daily as needed for anxiety.   [DISCONTINUED] amLODipine  (NORVASC ) 5 MG tablet Take 1 tablet (5 mg total) by mouth daily.   [DISCONTINUED] citalopram  (CELEXA ) 40 MG tablet Take 0.5 tablets (20 mg total) by mouth 2 (two) times daily.   ALPRAZolam  (XANAX ) 0.5 MG tablet Take 1 tablet (0.5 mg total) by mouth 2 (two) times daily as needed for anxiety.   amLODipine  (NORVASC ) 5 MG tablet Take 1 tablet (5 mg total) by mouth daily.   citalopram  (CELEXA ) 40 MG tablet Take 0.5 tablets (20 mg total) by mouth 2 (two) times daily.   No facility-administered encounter medications on file as of 04/02/2024.    Surgical History: Past Surgical History:  Procedure Laterality Date   COLONOSCOPY WITH PROPOFOL  N/A 03/04/2024   Procedure: COLONOSCOPY WITH PROPOFOL ;  Surgeon: Luke Salaam, MD;  Location: [facility] ENDOSCOPY;  Service: Gastroenterology;  Laterality: N/A;   POLYPECTOMY  03/04/2024   Procedure: POLYPECTOMY, INTESTINE;  Surgeon: Luke Salaam, MD;  Location: [facility] ENDOSCOPY;  Service: Gastroenterology;;   TUBAL LIGATION      Medical History: Past Medical History:  Diagnosis Date   Anxiety    Depression    GERD (gastroesophageal reflux disease)    Osteoporosis     Family History: Family History  Problem Relation Age of Onset   Heart disease Mother    Hyperlipidemia  Mother    Macular degeneration Mother    Glaucoma Mother    Hyperlipidemia Father    Aneurysm Father     Social History   Socioeconomic History   Marital status: Married    Spouse name: Not on file   Number of children: Not on file   Years of education: Not on file   Highest education level: Not on file  Occupational History   Not on file  Tobacco Use   Smoking status: Every Day    Current packs/day: 1.00    Average packs/day: 1 pack/day for 43.0 years (43.0 ttl pk-yrs)    Types: Cigarettes   Smokeless  tobacco: Never   Tobacco comments:    pt smoking 10 a day 02/25/21  Vaping Use   Vaping status: Never Used  Substance and Sexual Activity   Alcohol use: Not Currently   Drug use: Not Currently   Sexual activity: Not on file  Other Topics Concern   Not on file  Social History Narrative   Not on file   Social Drivers of Health   Financial Resource Strain: Not on file  Food Insecurity: Not on file  Transportation Needs: Not on file  Physical Activity: Not on file  Stress: Not on file  Social Connections: Not on file  Intimate Partner Violence: Not on file      Review of Systems  Constitutional:  Negative for chills, fatigue and unexpected weight change.  HENT:  Negative for congestion, rhinorrhea, sneezing and sore throat.   Eyes:  Negative for redness.  Respiratory:  Positive for cough. Negative for chest tightness, shortness of breath and wheezing.   Cardiovascular: Negative.  Negative for chest pain and palpitations.  Gastrointestinal:  Positive for constipation and nausea. Negative for abdominal pain, diarrhea and vomiting.  Genitourinary:  Negative for dysuria and frequency.  Musculoskeletal:  Positive for arthralgias and back pain. Negative for joint swelling and neck pain.  Skin:  Negative for rash.  Neurological: Negative.  Negative for tremors and numbness.  Hematological:  Negative for adenopathy. Does not bruise/bleed easily.  Psychiatric/Behavioral:  Positive for sleep disturbance. Negative for behavioral problems (Depression), self-injury and suicidal ideas. The patient is nervous/anxious.     Vital Signs: BP 135/80   Pulse 66   Temp 98.1 F (36.7 C)   Resp 16   Ht 5\' 2"  (1.575 m)   Wt 146 lb (66.2 kg)   SpO2 95%   BMI 26.70 kg/m    Physical Exam Vitals reviewed.  Constitutional:      General: She is not in acute distress.    Appearance: Normal appearance. She is not ill-appearing.  HENT:     Head: Normocephalic and atraumatic.  Eyes:     Pupils:  Pupils are equal, round, and reactive to light.  Cardiovascular:     Rate and Rhythm: Normal rate and regular rhythm.  Pulmonary:     Effort: Pulmonary effort is normal. No respiratory distress.  Neurological:     Mental Status: She is alert and oriented to person, place, and time.  Psychiatric:        Mood and Affect: Mood normal.        Behavior: Behavior normal.        Assessment/Plan: 1. Essential hypertension (Primary) Stable, continue amlodipine  as prescribed.  - amLODipine  (NORVASC ) 5 MG tablet; Take 1 tablet (5 mg total) by mouth daily.  Dispense: 90 tablet; Refill: 0  2. Acute upper back pain Xray of upper back ordered  and patient given prescription for methocarbamol  to help with the pain.  - DG Thoracic Spine W/Swimmers; Future - methocarbamol  (ROBAXIN ) 500 MG tablet; Take 1-2 tablets (500-1,000 mg total) by mouth every 6 (six) hours as needed for muscle spasms.  Dispense: 120 tablet; Refill: 2  3. Renal mass, right CT abdomen/pelvis ordered to get a better image of the possible mass in the right kidney. Follow up in office for results  - CT ABDOMEN PELVIS W WO CONTRAST; Future  4. Encounter for long-term (current) use of medications UDS done, was negative for benzodiazepines but this is ok since the patient is not taking the alprazolam  every day anymore. She has been decreasing the frequency that she is taking the medication.  - POCT Urine Drug Screen  5. Generalized anxiety disorder Continue celexa  as prescribed. Continue prn alprazolam  as prescribed. Follow up in 3 months for additional refills.  - ALPRAZolam  (XANAX ) 0.5 MG tablet; Take 1 tablet (0.5 mg total) by mouth 2 (two) times daily as needed for anxiety.  Dispense: 60 tablet; Refill: 2 - citalopram  (CELEXA ) 40 MG tablet; Take 0.5 tablets (20 mg total) by mouth 2 (two) times daily.  Dispense: 90 tablet; Refill: 0   General Counseling: Quadasia verbalizes understanding of the findings of todays visit and agrees  with plan of treatment. I have discussed any further diagnostic evaluation that may be needed or ordered today. We also reviewed her medications today. she has been encouraged to call the office with any questions or concerns that should arise related to todays visit.    Orders Placed This Encounter  Procedures   DG Thoracic Spine W/Swimmers   CT ABDOMEN PELVIS W WO CONTRAST   POCT Urine Drug Screen    Meds ordered this encounter  Medications   ALPRAZolam  (XANAX ) 0.5 MG tablet    Sig: Take 1 tablet (0.5 mg total) by mouth 2 (two) times daily as needed for anxiety.    Dispense:  60 tablet    Refill:  2    For future refills. It is medically necessary for the patient, do not let her run out.   citalopram  (CELEXA ) 40 MG tablet    Sig: Take 0.5 tablets (20 mg total) by mouth 2 (two) times daily.    Dispense:  90 tablet    Refill:  0   amLODipine  (NORVASC ) 5 MG tablet    Sig: Take 1 tablet (5 mg total) by mouth daily.    Dispense:  90 tablet    Refill:  0   methocarbamol  (ROBAXIN ) 500 MG tablet    Sig: Take 1-2 tablets (500-1,000 mg total) by mouth every 6 (six) hours as needed for muscle spasms.    Dispense:  120 tablet    Refill:  2    Fill new script today    Return in about 3 weeks (around 04/23/2024) for F/U xray and XT scan results with Nollan Muldrow and need 12 week f/u for anxiety me refill.   Total time spent:30 Minutes Time spent includes review of chart, medications, test results, and follow up plan with the patient.   [place] Controlled Substance Database was reviewed by me.  This patient was seen by Laurence Pons, FNP-C in collaboration with Dr. Verneta Gone as a part of collaborative care agreement.   Tavonte Seybold R. Bobbi Burow, MSN, FNP-C Internal medicine

## 2024-04-11 ENCOUNTER — Encounter: Payer: Self-pay | Admitting: Radiology

## 2024-04-11 ENCOUNTER — Ambulatory Visit
Admission: RE | Admit: 2024-04-11 | Discharge: 2024-04-11 | Disposition: A | Discharge status: Home / Self Care | Source: Ambulatory Visit | Attending: Nurse Practitioner | Admitting: Nurse Practitioner

## 2024-04-11 DIAGNOSIS — M549 Dorsalgia, unspecified: Secondary | ICD-10-CM

## 2024-04-11 DIAGNOSIS — N200 Calculus of kidney: Secondary | ICD-10-CM | POA: Diagnosis not present

## 2024-04-11 DIAGNOSIS — N2889 Other specified disorders of kidney and ureter: Secondary | ICD-10-CM

## 2024-04-11 DIAGNOSIS — N281 Cyst of kidney, acquired: Secondary | ICD-10-CM | POA: Diagnosis not present

## 2024-04-11 DIAGNOSIS — K449 Diaphragmatic hernia without obstruction or gangrene: Secondary | ICD-10-CM | POA: Diagnosis not present

## 2024-04-11 MED ORDER — IOPAMIDOL (ISOVUE-300) INJECTION 61%: 100.0000 mL | Freq: Once | INTRAVENOUS | Status: DC | PRN

## 2024-04-11 MED ORDER — IOPAMIDOL (ISOVUE-300) INJECTION 61%
100.0000 mL | Freq: Once | INTRAVENOUS | Status: AC | PRN
  Administered 2024-04-11: 100 mL via INTRAVENOUS

## 2024-04-23 ENCOUNTER — Encounter: Payer: Self-pay | Admitting: Nurse Practitioner

## 2024-04-23 ENCOUNTER — Ambulatory Visit (INDEPENDENT_AMBULATORY_CARE_PROVIDER_SITE_OTHER): Admitting: Nurse Practitioner

## 2024-04-23 VITALS — BP 130/85 | HR 68 | Temp 98.1°F | Resp 16 | Ht 62.0 in | Wt 144.0 lb

## 2024-04-23 DIAGNOSIS — K219 Gastro-esophageal reflux disease without esophagitis: Secondary | ICD-10-CM | POA: Diagnosis not present

## 2024-04-23 DIAGNOSIS — I10 Essential (primary) hypertension: Secondary | ICD-10-CM | POA: Diagnosis not present

## 2024-04-23 DIAGNOSIS — N200 Calculus of kidney: Secondary | ICD-10-CM | POA: Diagnosis not present

## 2024-04-23 DIAGNOSIS — F411 Generalized anxiety disorder: Secondary | ICD-10-CM | POA: Diagnosis not present

## 2024-04-23 DIAGNOSIS — K449 Diaphragmatic hernia without obstruction or gangrene: Secondary | ICD-10-CM

## 2024-04-23 NOTE — Progress Notes (Signed)
 [facility] [address] [place], [place] [address]  Internal MEDICINE  Office Visit Note  Patient Name: Felicia Blackwell  [id number]  [id number]  Date of Service: 04/23/2024  Chief Complaint  Patient presents with   Follow-up    X-rays and Ct results.     HPI Felicia Blackwell presents for a follow-up visit for CT and xray results.  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 She has a small kidney stone in the left kidney and a moderate to large hiatal hernia.  Thoracic spine xray was normal.     Current Medication: Outpatient Encounter Medications as of 04/23/2024  Medication Sig   albuterol  (VENTOLIN  HFA) 108 (90 Base) MCG/ACT inhaler TAKE 2 PUFFS BY MOUTH EVERY 6 HOURS AS NEEDED FOR WHEEZE OR SHORTNESS OF BREATH   alendronate  (FOSAMAX ) 70 MG tablet Take 1 tablet (70 mg total) by mouth once a week. Take with a full glass of water  on an empty stomach.   ALPRAZolam  (XANAX ) 0.5 MG tablet Take 1 tablet (0.5 mg total) by mouth 2 (two) times daily as needed for anxiety.   amLODipine  (NORVASC ) 5 MG tablet Take 1 tablet (5 mg total) by mouth daily.   citalopram  (CELEXA ) 40 MG tablet Take 0.5 tablets (20 mg total) by mouth 2 (two) times daily.   famotidine  (PEPCID ) 20 MG tablet TAKE 1 TABLET BY MOUTH TWICE A DAY   ipratropium-albuterol  (DUONEB) 0.5-2.5 (3) MG/3ML SOLN Take 3 mLs by nebulization every 6 (six) hours as needed.   levocetirizine (XYZAL ) 5 MG tablet Take 1 tablet (5 mg total) by mouth every evening.   methocarbamol  (ROBAXIN ) 500 MG tablet Take 1-2 tablets (500-1,000 mg total) by mouth every 6 (six) hours as needed for muscle spasms.   Multiple Vitamins-Minerals (MULTIVITAMIN ADULT PO) Take by mouth daily.   No facility-administered encounter medications on file as of 04/23/2024.    Surgical History: Past Surgical History:  Procedure Laterality Date   COLONOSCOPY WITH PROPOFOL  N/A 03/04/2024   Procedure: COLONOSCOPY WITH  PROPOFOL ;  Surgeon: Therisa Bi, MD;  Location: [facility] ENDOSCOPY;  Service: Gastroenterology;  Laterality: N/A;   POLYPECTOMY  03/04/2024   Procedure: POLYPECTOMY, INTESTINE;  Surgeon: Therisa Bi, MD;  Location: [facility] ENDOSCOPY;  Service: Gastroenterology;;   TUBAL LIGATION      Medical History: Past Medical History:  Diagnosis Date   Anxiety    Depression    GERD (gastroesophageal reflux disease)    Osteoporosis     Family History: Family History  Problem Relation Age of Onset   Heart disease Mother    Hyperlipidemia Mother    Macular degeneration Mother    Glaucoma Mother    Hyperlipidemia Father    Aneurysm Father     Social History   Socioeconomic History   Marital status: Married    Spouse name: Not on file   Number of children: Not on file   Years of education: Not on file   Highest education level: Not on file  Occupational History   Not on file  Tobacco Use   Smoking status: Every Day    Current packs/day: 1.00    Average packs/day: 1 pack/day for 43.0 years (43.0 ttl pk-yrs)    Types: Cigarettes   Smokeless tobacco: Never   Tobacco comments:    pt smoking 10 a day 02/25/21  Vaping Use   Vaping status: Never Used  Substance and Sexual Activity   Alcohol use: Not Currently  Drug use: Not Currently   Sexual activity: Not on file  Other Topics Concern   Not on file  Social History Narrative   Not on file   Social Drivers of Health   Financial Resource Strain: Not on file  Food Insecurity: Not on file  Transportation Needs: Not on file  Physical Activity: Not on file  Stress: Not on file  Social Connections: Not on file  Intimate Partner Violence: Not on file      Review of Systems  Constitutional:  Negative for chills, fatigue and unexpected weight change.  HENT:  Negative for congestion, rhinorrhea, sneezing and sore throat.   Eyes:  Negative for redness.  Respiratory:  Positive for cough. Negative for chest tightness, shortness of breath and  wheezing.   Cardiovascular: Negative.  Negative for chest pain and palpitations.  Gastrointestinal:  Positive for constipation and nausea. Negative for abdominal pain, diarrhea and vomiting.  Genitourinary:  Negative for dysuria and frequency.  Musculoskeletal:  Positive for arthralgias and back pain. Negative for joint swelling and neck pain.  Skin:  Negative for rash.  Neurological: Negative.  Negative for tremors and numbness.  Hematological:  Negative for adenopathy. Does not bruise/bleed easily.  Psychiatric/Behavioral:  Positive for sleep disturbance. Negative for behavioral problems (Depression), self-injury and suicidal ideas. The patient is nervous/anxious.     Vital Signs: BP 130/85   Pulse 68   Temp 98.1 F (36.7 C)   Resp 16   Ht 5' 2 (1.575 m)   Wt 144 lb (65.3 kg)   SpO2 94%   BMI 26.34 kg/m    Physical Exam Vitals reviewed.  Constitutional:      General: She is not in acute distress.    Appearance: Normal appearance. She is not ill-appearing.  HENT:     Head: Normocephalic and atraumatic.  Eyes:     Pupils: Pupils are equal, round, and reactive to light.  Cardiovascular:     Rate and Rhythm: Normal rate and regular rhythm.  Pulmonary:     Effort: Pulmonary effort is normal. No respiratory distress.  Neurological:     Mental Status: She is alert and oriented to person, place, and time.  Psychiatric:        Mood and Affect: Mood normal.        Behavior: Behavior normal.        Assessment/Plan: 1. Essential hypertension (Primary) Stable, continue amlodipine  as prescribed.   2. Hiatal hernia with GERD Continue to monitor, no further interventions at this time.   3. Left nephrolithiasis Continue to monitor periodically  4. Generalized anxiety disorder Continue alpreazolam as needed and celexa  as prescribed. follow up in 3 months for additional refills    General Counseling: [name] verbalizes understanding of the findings of todays visit and  agrees with plan of treatment. I have discussed any further diagnostic evaluation that may be needed or ordered today. We also reviewed her medications today. she has been encouraged to call the office with any questions or concerns that should arise related to todays visit.    No orders of the defined types were placed in this encounter.   No orders of the defined types were placed in this encounter.   Return in about 2 months (around 06/26/2024) for F/U, anxiety med refill, Bethann Qualley PCP.   Total time spent:30 Minutes Time spent includes review of chart, medications, test results, and follow up plan with the patient.   [place] Controlled Substance Database was reviewed by me.  This  patient was seen by Mardy Maxin, FNP-C in collaboration with Dr. Sigrid Bathe as a part of collaborative care agreement.   Jemaine Prokop R. Maxin, MSN, FNP-C Internal medicine

## 2024-04-28 ENCOUNTER — Encounter: Payer: Self-pay | Admitting: Nurse Practitioner

## 2024-04-28 DIAGNOSIS — I10 Essential (primary) hypertension: Secondary | ICD-10-CM | POA: Insufficient documentation

## 2024-05-27 ENCOUNTER — Encounter: Payer: Self-pay | Admitting: Nurse Practitioner

## 2024-06-25 ENCOUNTER — Ambulatory Visit: Admitting: Nurse Practitioner

## 2024-07-09 ENCOUNTER — Other Ambulatory Visit: Payer: Self-pay | Admitting: Nurse Practitioner

## 2024-07-09 DIAGNOSIS — F411 Generalized anxiety disorder: Secondary | ICD-10-CM

## 2024-07-27 ENCOUNTER — Other Ambulatory Visit: Payer: Self-pay | Admitting: Nurse Practitioner

## 2024-07-27 DIAGNOSIS — I10 Essential (primary) hypertension: Secondary | ICD-10-CM

## 2024-07-31 ENCOUNTER — Encounter: Payer: Self-pay | Admitting: Nurse Practitioner

## 2024-07-31 ENCOUNTER — Ambulatory Visit (INDEPENDENT_AMBULATORY_CARE_PROVIDER_SITE_OTHER): Admitting: Nurse Practitioner

## 2024-07-31 VITALS — BP 135/85 | HR 64 | Temp 98.0°F | Resp 16 | Ht 62.0 in | Wt 141.0 lb

## 2024-07-31 DIAGNOSIS — I10 Essential (primary) hypertension: Secondary | ICD-10-CM

## 2024-07-31 DIAGNOSIS — F411 Generalized anxiety disorder: Secondary | ICD-10-CM

## 2024-07-31 DIAGNOSIS — R062 Wheezing: Secondary | ICD-10-CM

## 2024-07-31 DIAGNOSIS — E782 Mixed hyperlipidemia: Secondary | ICD-10-CM | POA: Diagnosis not present

## 2024-07-31 DIAGNOSIS — K219 Gastro-esophageal reflux disease without esophagitis: Secondary | ICD-10-CM | POA: Diagnosis not present

## 2024-07-31 DIAGNOSIS — M25512 Pain in left shoulder: Secondary | ICD-10-CM

## 2024-07-31 DIAGNOSIS — F331 Major depressive disorder, recurrent, moderate: Secondary | ICD-10-CM

## 2024-07-31 DIAGNOSIS — E559 Vitamin D deficiency, unspecified: Secondary | ICD-10-CM | POA: Diagnosis not present

## 2024-07-31 MED ORDER — ALBUTEROL SULFATE HFA 108 (90 BASE) MCG/ACT IN AERS: 1.0000 | INHALATION_SPRAY | Freq: Four times a day (QID) | RESPIRATORY_TRACT | 3 refills | Status: AC | PRN

## 2024-07-31 MED ORDER — CYCLOBENZAPRINE HCL 10 MG PO TABS: 10.0000 mg | ORAL_TABLET | Freq: Every evening | ORAL | 3 refills | Status: DC | PRN

## 2024-07-31 MED ORDER — CITALOPRAM HYDROBROMIDE 40 MG PO TABS: 20.0000 mg | ORAL_TABLET | Freq: Two times a day (BID) | ORAL | 2 refills | Status: DC

## 2024-07-31 MED ORDER — ALPRAZOLAM 0.5 MG PO TABS: 0.5000 mg | ORAL_TABLET | Freq: Two times a day (BID) | ORAL | 2 refills | Status: DC | PRN

## 2024-07-31 MED ORDER — AMLODIPINE BESYLATE 5 MG PO TABS: 5.0000 mg | ORAL_TABLET | Freq: Every day | ORAL | 3 refills | Status: AC

## 2024-07-31 NOTE — Progress Notes (Signed)
 [facility] [address] [place], [place] [address]  Internal MEDICINE  Office Visit Note  Patient Name: Felicia Blackwell  [id number]  [id number]  Date of Service: 07/31/2024  Chief Complaint  Patient presents with   Depression   Gastroesophageal Reflux   Follow-up    HPI Kiondra presents for a follow-up visit for acute left shoulder, grieving, hypertension, high cholesterol, depression and anxiety.  Left shoulder pain -- has been bothering her for 2 months or so Has had a lot of death in the family lately. --grieving, increased depressed mood.  Hypertension -- controlled with amlodipine   High cholesterol -- not currently on any statin therapy.  Depression and anxiety-- takes alprazolam  as needed. Also taking citalopram  daily.     Current Medication: Outpatient Encounter Medications as of 07/31/2024  Medication Sig   cyclobenzaprine  (FLEXERIL ) 10 MG tablet Take 1 tablet (10 mg total) by mouth at bedtime as needed for muscle spasms.   albuterol  (VENTOLIN  HFA) 108 (90 Base) MCG/ACT inhaler Inhale 1-2 puffs into the lungs every 6 (six) hours as needed for wheezing or shortness of breath.   alendronate  (FOSAMAX ) 70 MG tablet Take 1 tablet (70 mg total) by mouth once a week. Take with a full glass of water  on an empty stomach.   ALPRAZolam  (XANAX ) 0.5 MG tablet Take 1 tablet (0.5 mg total) by mouth 2 (two) times daily as needed for anxiety.   amLODipine  (NORVASC ) 5 MG tablet Take 1 tablet (5 mg total) by mouth daily.   citalopram  (CELEXA ) 40 MG tablet Take 0.5 tablets (20 mg total) by mouth 2 (two) times daily.   famotidine  (PEPCID ) 20 MG tablet TAKE 1 TABLET BY MOUTH TWICE A DAY   ipratropium-albuterol  (DUONEB) 0.5-2.5 (3) MG/3ML SOLN Take 3 mLs by nebulization every 6 (six) hours as needed.   levocetirizine (XYZAL ) 5 MG tablet Take 1 tablet (5 mg total) by mouth every evening.   Multiple Vitamins-Minerals (MULTIVITAMIN ADULT PO) Take by mouth daily.   [DISCONTINUED]  albuterol  (VENTOLIN  HFA) 108 (90 Base) MCG/ACT inhaler TAKE 2 PUFFS BY MOUTH EVERY 6 HOURS AS NEEDED FOR WHEEZE OR SHORTNESS OF BREATH   [DISCONTINUED] ALPRAZolam  (XANAX ) 0.5 MG tablet Take 1 tablet (0.5 mg total) by mouth 2 (two) times daily as needed for anxiety.   [DISCONTINUED] amLODipine  (NORVASC ) 5 MG tablet TAKE 1 TABLET (5 MG TOTAL) BY MOUTH DAILY.   [DISCONTINUED] citalopram  (CELEXA ) 40 MG tablet TAKE 0.5 TABLETS (20 MG TOTAL) BY MOUTH 2 (TWO) TIMES DAILY.   [DISCONTINUED] methocarbamol  (ROBAXIN ) 500 MG tablet Take 1-2 tablets (500-1,000 mg total) by mouth every 6 (six) hours as needed for muscle spasms.   No facility-administered encounter medications on file as of 07/31/2024.    Surgical History: Past Surgical History:  Procedure Laterality Date   COLONOSCOPY WITH PROPOFOL  N/A 03/04/2024   Procedure: COLONOSCOPY WITH PROPOFOL ;  Surgeon: Therisa Bi, MD;  Location: [facility] ENDOSCOPY;  Service: Gastroenterology;  Laterality: N/A;   POLYPECTOMY  03/04/2024   Procedure: POLYPECTOMY, INTESTINE;  Surgeon: Therisa Bi, MD;  Location: [facility] ENDOSCOPY;  Service: Gastroenterology;;   TUBAL LIGATION      Medical History: Past Medical History:  Diagnosis Date   Anxiety    Depression    GERD (gastroesophageal reflux disease)    Osteoporosis     Family History: Family History  Problem Relation Age of Onset   Heart disease Mother    Hyperlipidemia Mother    Macular degeneration Mother    Glaucoma Mother  Hyperlipidemia Father    Aneurysm Father     Social History   Socioeconomic History   Marital status: Married    Spouse name: Not on file   Number of children: Not on file   Years of education: Not on file   Highest education level: Not on file  Occupational History   Not on file  Tobacco Use   Smoking status: Every Day    Current packs/day: 1.00    Average packs/day: 1 pack/day for 43.0 years (43.0 ttl pk-yrs)    Types: Cigarettes   Smokeless tobacco: Never   Tobacco  comments:    pt smoking 10 a day 02/25/21  Vaping Use   Vaping status: Never Used  Substance and Sexual Activity   Alcohol use: Not Currently   Drug use: Not Currently   Sexual activity: Not on file  Other Topics Concern   Not on file  Social History Narrative   Not on file   Social Drivers of Health   Financial Resource Strain: Not on file  Food Insecurity: Not on file  Transportation Needs: Not on file  Physical Activity: Not on file  Stress: Not on file  Social Connections: Not on file  Intimate Partner Violence: Not on file      Review of Systems  Constitutional:  Negative for chills, fatigue and unexpected weight change.  HENT:  Negative for congestion, rhinorrhea, sneezing and sore throat.   Eyes:  Negative for redness.  Respiratory:  Positive for cough. Negative for chest tightness, shortness of breath and wheezing.   Cardiovascular: Negative.  Negative for chest pain and palpitations.  Gastrointestinal:  Positive for constipation and nausea. Negative for abdominal pain, diarrhea and vomiting.  Genitourinary:  Negative for dysuria and frequency.  Musculoskeletal:  Positive for arthralgias and back pain. Negative for joint swelling and neck pain.  Skin:  Negative for rash.  Neurological: Negative.  Negative for tremors and numbness.  Hematological:  Negative for adenopathy. Does not bruise/bleed easily.  Psychiatric/Behavioral:  Positive for sleep disturbance. Negative for behavioral problems (Depression), self-injury and suicidal ideas. The patient is nervous/anxious.     Vital Signs: BP 135/85   Pulse 64   Temp 98 F (36.7 C)   Resp 16   Ht 5' 2 (1.575 m)   Wt 141 lb (64 kg)   SpO2 96%   BMI 25.79 kg/m    Physical Exam Vitals reviewed.  Constitutional:      General: She is not in acute distress.    Appearance: Normal appearance. She is not ill-appearing.  HENT:     Head: Normocephalic and atraumatic.  Eyes:     Pupils: Pupils are equal, round, and  reactive to light.  Cardiovascular:     Rate and Rhythm: Normal rate and regular rhythm.  Pulmonary:     Effort: Pulmonary effort is normal. No respiratory distress.  Neurological:     Mental Status: She is alert and oriented to person, place, and time.  Psychiatric:        Mood and Affect: Mood normal.        Behavior: Behavior normal.        Assessment/Plan: 1. Acute pain of left shoulder (Primary) Referred to orthopedic surgery. And take cyclobenzaprine  as prescribed.  - cyclobenzaprine  (FLEXERIL ) 10 MG tablet; Take 1 tablet (10 mg total) by mouth at bedtime as needed for muscle spasms.  Dispense: 30 tablet; Refill: 3 - Ambulatory referral to Orthopedic Surgery  2. Essential hypertension Stable, continue amlodipine   as prescribed.  - amLODipine  (NORVASC ) 5 MG tablet; Take 1 tablet (5 mg total) by mouth daily.  Dispense: 90 tablet; Refill: 3  3. Wheezing Continue prn albuterol  inhaler.  - albuterol  (VENTOLIN  HFA) 108 (90 Base) MCG/ACT inhaler; Inhale 1-2 puffs into the lungs every 6 (six) hours as needed for wheezing or shortness of breath.  Dispense: 18 each; Refill: 3  4. Mixed hyperlipidemia Limit red meat intake, increase lean proteins in diet, and increase physical activity as tolerated.   5. Gastroesophageal reflux disease without esophagitis Continue famotidine  as prescribed.   6. Vitamin D  deficiency Continue vitamin D  supplement.   7. Moderate episode of recurrent major depressive disorder (HCC) Continue citalopram  as prescribed   8. Generalized anxiety disorder Continue citalopram  and prn alprazolam  as prescribed. Follow up in 3 months for additional alprazolam  refills  - ALPRAZolam  (XANAX ) 0.5 MG tablet; Take 1 tablet (0.5 mg total) by mouth 2 (two) times daily as needed for anxiety.  Dispense: 60 tablet; Refill: 2 - citalopram  (CELEXA ) 40 MG tablet; Take 0.5 tablets (20 mg total) by mouth 2 (two) times daily.  Dispense: 30 tablet; Refill: 2   General  Counseling: Lylie verbalizes understanding of the findings of todays visit and agrees with plan of treatment. I have discussed any further diagnostic evaluation that may be needed or ordered today. We also reviewed her medications today. she has been encouraged to call the office with any questions or concerns that should arise related to todays visit.    Orders Placed This Encounter  Procedures   Ambulatory referral to Orthopedic Surgery    Meds ordered this encounter  Medications   ALPRAZolam  (XANAX ) 0.5 MG tablet    Sig: Take 1 tablet (0.5 mg total) by mouth 2 (two) times daily as needed for anxiety.    Dispense:  60 tablet    Refill:  2    For future refills. It is medically necessary for the patient, do not let her run out.   cyclobenzaprine  (FLEXERIL ) 10 MG tablet    Sig: Take 1 tablet (10 mg total) by mouth at bedtime as needed for muscle spasms.    Dispense:  30 tablet    Refill:  3    Fill new script today   albuterol  (VENTOLIN  HFA) 108 (90 Base) MCG/ACT inhaler    Sig: Inhale 1-2 puffs into the lungs every 6 (six) hours as needed for wheezing or shortness of breath.    Dispense:  18 each    Refill:  3   citalopram  (CELEXA ) 40 MG tablet    Sig: Take 0.5 tablets (20 mg total) by mouth 2 (two) times daily.    Dispense:  30 tablet    Refill:  2   amLODipine  (NORVASC ) 5 MG tablet    Sig: Take 1 tablet (5 mg total) by mouth daily.    Dispense:  90 tablet    Refill:  3    Return in about 12 weeks (around 10/23/2024) for F/U, anxiety med refill, Jaydee Conran PCP.   Total time spent:30 Minutes Time spent includes review of chart, medications, test results, and follow up plan with the patient.   [place] Controlled Substance Database was reviewed by me.  This patient was seen by Mardy Maxin, FNP-C in collaboration with Dr. Sigrid Bathe as a part of collaborative care agreement.   Celsa Nordahl R. Maxin, MSN, FNP-C Internal medicine

## 2024-08-01 ENCOUNTER — Telehealth: Payer: Self-pay | Admitting: Nurse Practitioner

## 2024-08-01 NOTE — Telephone Encounter (Signed)
 Awaiting 07/31/24 office notes for Orthopedic referral-Toni

## 2024-08-12 DIAGNOSIS — M4722 Other spondylosis with radiculopathy, cervical region: Secondary | ICD-10-CM | POA: Diagnosis not present

## 2024-08-12 DIAGNOSIS — M25511 Pain in right shoulder: Secondary | ICD-10-CM | POA: Diagnosis not present

## 2024-08-12 DIAGNOSIS — M25512 Pain in left shoulder: Secondary | ICD-10-CM | POA: Diagnosis not present

## 2024-08-19 ENCOUNTER — Telehealth: Payer: Self-pay | Admitting: Nurse Practitioner

## 2024-08-19 NOTE — Telephone Encounter (Signed)
 Patient came into office regarding orthopedic referral. She stated she went to [facility] walk in. Did not like them. Asked me not to send her there once office notes closed. I redirected her referral via Epic to Dr. Gust. Printed and highlighted their information for her. Instructed her to call their office to schedule appointment if she has not heard from them in about a week-Felicia Blackwell

## 2024-08-22 ENCOUNTER — Ambulatory Visit

## 2024-08-26 ENCOUNTER — Ambulatory Visit (INDEPENDENT_AMBULATORY_CARE_PROVIDER_SITE_OTHER)

## 2024-08-26 ENCOUNTER — Other Ambulatory Visit

## 2024-08-26 DIAGNOSIS — M542 Cervicalgia: Secondary | ICD-10-CM

## 2024-08-26 DIAGNOSIS — M4722 Other spondylosis with radiculopathy, cervical region: Secondary | ICD-10-CM

## 2024-08-26 NOTE — Patient Instructions (Signed)

## 2024-08-26 NOTE — Progress Notes (Signed)
 Orthopaedic Surgery New Patient Visit   History of Present Illness: The patient is a 68 y.o. female seen in clinic for left sided neck pain and shoulder pain.  She reports no known trauma or injury which preceded the symptoms.  Pain has been ongoing since May of this year.  Reports that symptoms started as cramping over the shoulder blade but progressed into electric shock radiating over the shoulder down into the left hand.  She states the symptoms have not affected her shoulder range of motion.  She does report associated weakness and pain that wakes her up from sleep.  Patient has tried ibuprofen as well as Flexeril  which has not provided much relief.  Denies systemic symptoms such as fever and rapid unintentional weight loss.  Patient was initially seen on 08/12/2024 at [facility]. Patient reported shoulder pain and radiculopathy symptoms.  Radiographs of the cervical spine and bilateral shoulders were obtained.  Patient was diagnosed with cervical spondylosis, prescribed Medrol dose pak and refill of methocarbamol , also cervical spine MRI ordered but patient never obtained the MRI.  Also seen by PCP, Mardy Maxin NP with [facility], on 04/02/2024, 04/23/2024 and 07/31/2024 with similar complaints. Xray of upper back (thoracic spine w/ swimmers) ordered and patient given prescription for methocarbamol  (at one visit) and Flexeril  (at a f/u visit).  Recommended by the primary care provider to be seen for second opinion of her symptoms.  No diabetes diagnosis. Patient is a current smoker with 52 pack/year history.  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: Past Medical History:  Diagnosis Date   Anxiety    Depression    GERD (gastroesophageal reflux disease)    Osteoporosis    Past Surgical History:  Procedure Laterality Date   COLONOSCOPY WITH PROPOFOL  N/A 03/04/2024   Procedure: COLONOSCOPY WITH PROPOFOL ;  Surgeon: Therisa Bi, MD;  Location: [facility]  ENDOSCOPY;  Service: Gastroenterology;  Laterality: N/A;   POLYPECTOMY  03/04/2024   Procedure: POLYPECTOMY, INTESTINE;  Surgeon: Therisa Bi, MD;  Location: [facility] ENDOSCOPY;  Service: Gastroenterology;;   TUBAL LIGATION     Allergies  Allergen Reactions   Cat Dander Shortness Of Breath   Codeine Hives, Itching and Nausea Only   Dust Mite Extract Shortness Of Breath   Current Outpatient Medications on File Prior to Visit  Medication Sig Dispense Refill   albuterol  (VENTOLIN  HFA) 108 (90 Base) MCG/ACT inhaler Inhale 1-2 puffs into the lungs every 6 (six) hours as needed for wheezing or shortness of breath. 18 each 3   alendronate  (FOSAMAX ) 70 MG tablet Take 1 tablet (70 mg total) by mouth once a week. Take with a full glass of water  on an empty stomach. 12 tablet 4   ALPRAZolam  (XANAX ) 0.5 MG tablet Take 1 tablet (0.5 mg total) by mouth 2 (two) times daily as needed for anxiety. 60 tablet 2   amLODipine  (NORVASC ) 5 MG tablet Take 1 tablet (5 mg total) by mouth daily. 90 tablet 3   citalopram  (CELEXA ) 40 MG tablet Take 0.5 tablets (20 mg total) by mouth 2 (two) times daily. 30 tablet 2   cyclobenzaprine  (FLEXERIL ) 10 MG tablet Take 1 tablet (10 mg total) by mouth at bedtime as needed for muscle spasms. 30 tablet 3   famotidine  (PEPCID ) 20 MG tablet TAKE 1 TABLET BY MOUTH TWICE A DAY 180 tablet 2   ipratropium-albuterol  (DUONEB) 0.5-2.5 (3) MG/3ML SOLN Take 3 mLs by nebulization every 6 (six) hours as needed. 120 mL 3  levocetirizine (XYZAL ) 5 MG tablet Take 1 tablet (5 mg total) by mouth every evening. 30 tablet 2   Multiple Vitamins-Minerals (MULTIVITAMIN ADULT PO) Take by mouth daily.     No current facility-administered medications on file prior to visit.   Social History   Tobacco Use   Smoking status: Every Day    Current packs/day: 1.00    Average packs/day: 1 pack/day for 43.0 years (43.0 ttl pk-yrs)    Types: Cigarettes   Smokeless tobacco: Never   Tobacco comments:    pt  smoking 10 a day 02/25/21  Vaping Use   Vaping status: Never Used  Substance Use Topics   Alcohol use: Not Currently   Drug use: Not Currently      I have reviewed past medical, surgical, social and family history, medications and allergies as documented in the EMR.  Review of Systems - A ROS was performed including pertinent positives and negatives as documented in the HPI.    Physical Exam:  General/Constitutional: NAD Vascular: No edema, swelling or tenderness, except as noted in detailed exam Integumentary: No impressive skin lesions present, except as noted in detailed exam Neuro/Psych: Normal mood and affect, oriented to person, place and time Musculoskeletal: Normal, except as noted in detailed exam and in HPI   MSK (spine):  Strength exam      Left  Right Grip strength                5/5  5/5 Interosseus   5/5   5/5 Wrist extension  5/5  5/5 Wrist flexion   5/5  5/5 Elbow flexion   5/5  5/5 Deltoid    5/5  5/5   Sensory exam: intact to light touch in C5-T1 nerve distributions of bilateral upper extremities.  Paresthesias noted over the lateral deltoid on the left  Palpation: Nontender to palpation along the midline spine.  Tenderness palpation about the paraspinal musculature on the left and into the trapezial region ROM: Slightly limited range of motion with sidebending to the left as well as rotation.  Flexion and extension within normal limits however with pain Spurling's positive for pain on the left   Shoulder focused exam:    RIGHT LEFT  Scapula Atrophy none none   Winging none none  Rotator cuff Supraspinatus 5/5 5/5   Infraspinatus 5/5 5/5   Subscapularis 5/5 5/5  AROM/PROM (degrees) FF 0-155 / 0-170 0-155 / 0-170   ER0 0-45 / 0-45 0-45 / 0-45   IR(back) L1 / L1 L1 / L1  Palpation (pain): AC negative negative   Biceps negative negative   Coracoid negative negative  Special Tests: O'Briens negative negative   Speeds  negative negative   Jobe's  negative negative   Neer negative negative   Hawkins negative negative   Belly Press negative negative  Other: Lateral deltoid 5/5 5/5    Vascular/Lymphatic: Fingers warm and well perfused   Neurologic: Sensation intact to the Median, Ulnar and Radial nerve distribution of the hand. Sensation intact to lateral deltoid (axillary nerve).        XR cervical spine imaging: X-rays of the cervical spine including AP and lateral views were obtained on 08/12/2024 at [facility] and were available via disc today.  Per my interpretation, there are no fractures or dislocations.  Moderate to severe degenerative disc disease at C4/C5 and C5/C6 with endplate changes and foraminal narrowing.  Calcification noted anterior to the vertebral bodies around C3-C5.  X-ray bilateral shoulder imaging: X-rays of  the bilateral shoulders including AP, scapular Y and axillary views were obtained on 08/12/2024 at [facility] and were available via disc today.  Per my interpretation, there are no fractures or dislocations.  Mild degenerative changes noted about bilateral acromioclavicular joints.  Small inferior osteophyte noted on the humeral head of the left shoulder.    Assessment:  Cervical spondylosis at multiple levels with radicular symptoms on the left side  Plan:  Patient was seen and examined in office today. We reviewed patient's history, examination, and imaging in detail. Based on information available for this encounter, patient likely symptomatic from the pathology within her cervical spine.  We discussed the natural course of her disease process and correlation with her symptoms of muscle cramping, pain and radicular symptoms into the left hand.  Her imaging correlates with her symptoms.  We do not believe that her left shoulder is causing her symptoms at this time.  Her exam demonstrated near full range of motion with good strength and minimal pain with isolated maneuvers in the shoulder specifically.   We believe patient would benefit from referral to neurosurgery for further workup of her cervical spine.  Referral has been placed for patient today.  In the interim, suggested the patient continue with anti-inflammatory medication as tolerated until further recommendations can be made from neurosurgery.  She may follow-up with our office as needed.  All questions, concerns and comments were addressed to the best of my ability.  Follow-up: As needed   Arlyss GEANNIE Schneider, DO Orthopedic Surgery & Sports Medicine [place]   This document was dictated using Dragon voice recognition software. A reasonable attempt at proof reading has been made to minimize errors.

## 2024-09-06 ENCOUNTER — Encounter: Payer: Self-pay | Admitting: Nurse Practitioner

## 2024-09-06 DIAGNOSIS — M25512 Pain in left shoulder: Secondary | ICD-10-CM | POA: Insufficient documentation

## 2024-09-12 NOTE — Progress Notes (Unsigned)
 Referring Physician:  Gust Molly, DO [address] [place],  [place] [address]  Primary Physician:  Liana Fish, NP  History of Present Illness: 09/18/2024 [name] is here today with a chief complaint of neck and left-sided shoulder neck pain x 5 months.  She is having muscle spasms and numbness, tingling, weakness extending into her left arm.  She describes it as an electrical shock that goes down her left arm into her forearm.  She feels as though the muscle spasms started in her upper arm and are moving down.  She adds that the strength is decreased in her left hand.  Denies any changes to her gait.  Denies any saddle anesthesia or incontinence.  Unfortunately has tried Robaxin  which made her sick.  Flexeril  she states did not help.   Weakness: none  Bowel/Bladder Dysfunction: none  Conservative measures:  Physical therapy:  Has not participated in Multimodal medical therapy including regular antiinflammatories:  cyclobenzaprine , Methocarbamol , Aleve Injections: no epidural steroid injections  Past Surgery: none  [name] has no symptoms of cervical myelopathy.  The symptoms are causing a significant impact on the patient's life.   Review of Systems:  A 10 point review of systems is negative, except for the pertinent positives and negatives detailed in the HPI.  Past Medical History: Past Medical History:  Diagnosis Date   Anxiety    Depression    GERD (gastroesophageal reflux disease)    Osteoporosis     Past Surgical History: Past Surgical History:  Procedure Laterality Date   COLONOSCOPY WITH PROPOFOL  N/A 03/04/2024   Procedure: COLONOSCOPY WITH PROPOFOL ;  Surgeon: Therisa Bi, MD;  Location: [facility] ENDOSCOPY;  Service: Gastroenterology;  Laterality: N/A;   POLYPECTOMY  03/04/2024   Procedure: POLYPECTOMY, INTESTINE;  Surgeon: Therisa Bi, MD;  Location: [facility] ENDOSCOPY;  Service: Gastroenterology;;   TUBAL LIGATION       Allergies: Allergies as of 09/18/2024 - Review Complete 09/18/2024  Allergen Reaction Noted   Cat dander Shortness Of Breath 05/01/2017   Codeine Hives, Itching, and Nausea Only 10/21/2016   Dust mite extract Shortness Of Breath 05/01/2017    Medications: Outpatient Encounter Medications as of 09/18/2024  Medication Sig   albuterol  (VENTOLIN  HFA) 108 (90 Base) MCG/ACT inhaler Inhale 1-2 puffs into the lungs every 6 (six) hours as needed for wheezing or shortness of breath.   alendronate  (FOSAMAX ) 70 MG tablet Take 1 tablet (70 mg total) by mouth once a week. Take with a full glass of water  on an empty stomach.   ALPRAZolam  (XANAX ) 0.5 MG tablet Take 1 tablet (0.5 mg total) by mouth 2 (two) times daily as needed for anxiety.   amLODipine  (NORVASC ) 5 MG tablet Take 1 tablet (5 mg total) by mouth daily.   citalopram  (CELEXA ) 40 MG tablet Take 0.5 tablets (20 mg total) by mouth 2 (two) times daily.   cyclobenzaprine  (FLEXERIL ) 10 MG tablet Take 1 tablet (10 mg total) by mouth at bedtime as needed for muscle spasms.   famotidine  (PEPCID ) 20 MG tablet TAKE 1 TABLET BY MOUTH TWICE A DAY   ipratropium-albuterol  (DUONEB) 0.5-2.5 (3) MG/3ML SOLN Take 3 mLs by nebulization every 6 (six) hours as needed.   levocetirizine (XYZAL ) 5 MG tablet Take 1 tablet (5 mg total) by mouth every evening.   Multiple Vitamins-Minerals (MULTIVITAMIN ADULT PO) Take by mouth daily.   No facility-administered encounter medications on file as of 09/18/2024.    Social History: Social History   Tobacco  Use   Smoking status: Every Day    Current packs/day: 1.00    Average packs/day: 1 pack/day for 43.0 years (43.0 ttl pk-yrs)    Types: Cigarettes   Smokeless tobacco: Never   Tobacco comments:    pt smoking 10 a day 02/25/21  Vaping Use   Vaping status: Never Used  Substance Use Topics   Alcohol use: Not Currently   Drug use: Not Currently    Family Medical History: Family History  Problem Relation Age  of Onset   Heart disease Mother    Hyperlipidemia Mother    Macular degeneration Mother    Glaucoma Mother    Hyperlipidemia Father    Aneurysm Father     Physical Examination: @VITALWITHPAIN @  General: Patient is well developed, well nourished, calm, collected, and in no apparent distress. Attention to examination is appropriate.  Psychiatric: Patient is non-anxious.  Head:  Pupils equal, round, and reactive to light.  ENT:  Oral mucosa appears well hydrated.  Neck:   Supple.    Respiratory: Patient is breathing without any difficulty.  Extremities: No edema.  Vascular: Palpable dorsal pedal pulses.  Skin:   On exposed skin, there are no abnormal skin lesions.  NEUROLOGICAL:     Awake, alert, oriented to person, place, and time.  Speech is clear and fluent. Fund of knowledge is appropriate.   Cranial Nerves: Pupils equal round and reactive to light.  Facial tone is symmetric.   ROM of spine: Mild tenderness with  palpation to cervical spine.    Strength: Side Biceps Triceps Deltoid Interossei Grip Wrist Ext. Wrist Flex.  R 5 5 5 5 5 5 5   L 5 4 5 4  4+ 4+ 5   Positive Hoffmann sign.  Normoreflexic throughout bilateral upper extremities.  Bilateral upper and lower extremity sensation is intact to light touch.    Gait is normal.   No difficulty with tandem gait.   No evidence of dysmetria noted.  Medical Decision Making  Imaging: XR cervical spine imaging: X-rays of the cervical spine including AP and lateral views were obtained on 08/12/2024 at [facility] and were available via disc today.  Per my interpretation, there are no fractures or dislocations.  Moderate to severe degenerative disc disease at C4/C5 and C5/C6 with endplate changes and foraminal narrowing.  Calcification noted anterior to the vertebral bodies around C3-C5.   I have personally reviewed the images and agree with the above interpretation.  Assessment and Plan: [name] is a pleasant 68  y.o. female with cervical radiculopathy x 5 months with associated numbness, tingling, weakness in her left upper extremity.  Plan includes the following moving forward:  -Flexion and extension x-rays of cervical spine to evaluate for listhesis - MRI of cervical spine for evaluation of cervical stenosis - Referral sent to physical therapy - Plan to start gabapentin for her neuropathic pain.  The risk-benefit of this medication were discussed at length with her - Tizanidine given.  Patient has not had much relief with the other 2 muscle relaxers tried.  I did explain the risk of this medication. - See back in 8 weeks  Thank you for involving me in the care of this patient.    Lyle Decamp, PA-C Dept. of Neurosurgery

## 2024-09-18 ENCOUNTER — Ambulatory Visit
Admission: RE | Admit: 2024-09-18 | Discharge: 2024-09-18 | Disposition: A | Discharge status: Home / Self Care | Payer: Self-pay | Source: Ambulatory Visit | Attending: Physician Assistant | Admitting: Physician Assistant

## 2024-09-18 ENCOUNTER — Other Ambulatory Visit: Payer: Self-pay

## 2024-09-18 ENCOUNTER — Encounter: Payer: Self-pay | Admitting: Physician Assistant

## 2024-09-18 ENCOUNTER — Ambulatory Visit: Admitting: Physician Assistant

## 2024-09-18 ENCOUNTER — Ambulatory Visit
Admission: RE | Admit: 2024-09-18 | Discharge: 2024-09-18 | Disposition: A | Payer: Self-pay | Source: Ambulatory Visit | Attending: Physician Assistant | Admitting: Physician Assistant

## 2024-09-18 ENCOUNTER — Ambulatory Visit

## 2024-09-18 VITALS — BP 128/84 | Ht 62.0 in | Wt 145.0 lb

## 2024-09-18 DIAGNOSIS — Z049 Encounter for examination and observation for unspecified reason: Secondary | ICD-10-CM

## 2024-09-18 DIAGNOSIS — M5010 Cervical disc disorder with radiculopathy, unspecified cervical region: Secondary | ICD-10-CM | POA: Diagnosis not present

## 2024-09-18 DIAGNOSIS — M50122 Cervical disc disorder at C5-C6 level with radiculopathy: Secondary | ICD-10-CM

## 2024-09-18 DIAGNOSIS — M50121 Cervical disc disorder at C4-C5 level with radiculopathy: Secondary | ICD-10-CM

## 2024-09-18 DIAGNOSIS — M47812 Spondylosis without myelopathy or radiculopathy, cervical region: Secondary | ICD-10-CM | POA: Diagnosis not present

## 2024-09-18 DIAGNOSIS — M5032 Other cervical disc degeneration, mid-cervical region, unspecified level: Secondary | ICD-10-CM | POA: Diagnosis not present

## 2024-09-18 MED ORDER — TIZANIDINE HCL 4 MG PO TABS: 4.0000 mg | ORAL_TABLET | Freq: Three times a day (TID) | ORAL | 1 refills | Status: AC

## 2024-09-18 MED ORDER — GABAPENTIN 300 MG PO CAPS: 300.0000 mg | ORAL_CAPSULE | Freq: Three times a day (TID) | ORAL | 2 refills | Status: DC

## 2024-09-20 ENCOUNTER — Other Ambulatory Visit: Payer: Self-pay | Admitting: Nurse Practitioner

## 2024-09-20 DIAGNOSIS — Z1231 Encounter for screening mammogram for malignant neoplasm of breast: Secondary | ICD-10-CM

## 2024-09-23 ENCOUNTER — Encounter: Payer: Self-pay | Admitting: Radiology

## 2024-09-26 ENCOUNTER — Encounter: Payer: Self-pay | Admitting: Physician Assistant

## 2024-10-02 ENCOUNTER — Ambulatory Visit
Admission: RE | Admit: 2024-10-02 | Discharge: 2024-10-02 | Disposition: A | Discharge status: Home / Self Care | Source: Ambulatory Visit | Attending: Physician Assistant | Admitting: Physician Assistant

## 2024-10-02 DIAGNOSIS — M5010 Cervical disc disorder with radiculopathy, unspecified cervical region: Secondary | ICD-10-CM

## 2024-10-02 DIAGNOSIS — M4802 Spinal stenosis, cervical region: Secondary | ICD-10-CM | POA: Diagnosis not present

## 2024-10-02 DIAGNOSIS — M4722 Other spondylosis with radiculopathy, cervical region: Secondary | ICD-10-CM | POA: Diagnosis not present

## 2024-10-02 DIAGNOSIS — M5011 Cervical disc disorder with radiculopathy,  high cervical region: Secondary | ICD-10-CM | POA: Diagnosis not present

## 2024-10-02 DIAGNOSIS — M50123 Cervical disc disorder at C6-C7 level with radiculopathy: Secondary | ICD-10-CM | POA: Diagnosis not present

## 2024-10-10 ENCOUNTER — Other Ambulatory Visit: Payer: Self-pay

## 2024-10-10 MED ORDER — MUPIROCIN 2 % EX OINT: 1.0000 | TOPICAL_OINTMENT | Freq: Two times a day (BID) | CUTANEOUS | 0 refills | Status: AC

## 2024-10-16 ENCOUNTER — Encounter: Payer: Self-pay | Admitting: Nurse Practitioner

## 2024-10-16 ENCOUNTER — Ambulatory Visit (INDEPENDENT_AMBULATORY_CARE_PROVIDER_SITE_OTHER): Payer: 59 | Admitting: Nurse Practitioner

## 2024-10-16 VITALS — BP 138/80 | HR 71 | Temp 96.3°F | Resp 16 | Ht 62.0 in | Wt 147.4 lb

## 2024-10-16 DIAGNOSIS — E538 Deficiency of other specified B group vitamins: Secondary | ICD-10-CM

## 2024-10-16 DIAGNOSIS — F411 Generalized anxiety disorder: Secondary | ICD-10-CM

## 2024-10-16 DIAGNOSIS — K219 Gastro-esophageal reflux disease without esophagitis: Secondary | ICD-10-CM | POA: Diagnosis not present

## 2024-10-16 DIAGNOSIS — L0100 Impetigo, unspecified: Secondary | ICD-10-CM

## 2024-10-16 DIAGNOSIS — R300 Dysuria: Secondary | ICD-10-CM | POA: Diagnosis not present

## 2024-10-16 DIAGNOSIS — E782 Mixed hyperlipidemia: Secondary | ICD-10-CM

## 2024-10-16 DIAGNOSIS — E559 Vitamin D deficiency, unspecified: Secondary | ICD-10-CM

## 2024-10-16 DIAGNOSIS — Z0001 Encounter for general adult medical examination with abnormal findings: Secondary | ICD-10-CM | POA: Diagnosis not present

## 2024-10-16 MED ORDER — CITALOPRAM HYDROBROMIDE 40 MG PO TABS: 20.0000 mg | ORAL_TABLET | Freq: Two times a day (BID) | ORAL | 2 refills | Status: DC

## 2024-10-16 MED ORDER — ALPRAZOLAM 0.5 MG PO TABS: 0.5000 mg | ORAL_TABLET | Freq: Two times a day (BID) | ORAL | 2 refills | Status: AC | PRN

## 2024-10-16 MED ORDER — FAMOTIDINE 20 MG PO TABS
20.0000 mg | ORAL_TABLET | Freq: Two times a day (BID) | ORAL | 2 refills | Status: AC
Start: 2024-10-16 — End: ?

## 2024-10-16 NOTE — Progress Notes (Signed)
 [facility] [address] [place], [place] [address]  Internal MEDICINE  Office Visit Note  Patient Name: Felicia Blackwell  [id number]  [id number]  Date of Service: 10/16/2024  Chief Complaint  Patient presents with   Depression   Gastroesophageal Reflux   Annual Exam    HPI Charish presents for an annual well visit and physical exam.  Well-appearing 68 y.o. female with hypertension, high cholesterol, depression, GERD, anxiety and fatigue.  Routine CRC screening: colonoscopy done in April this year. Due again in 2035 Routine mammogram: scheduled for december DEXA scan: done in 2023 Pap smear: discontinued, aged out.  Labs: due for routine labs  New or worsening pain: none  Other concerns: none     Current Medication: Outpatient Encounter Medications as of 10/16/2024  Medication Sig   albuterol  (VENTOLIN  HFA) 108 (90 Base) MCG/ACT inhaler Inhale 1-2 puffs into the lungs every 6 (six) hours as needed for wheezing or shortness of breath.   alendronate  (FOSAMAX ) 70 MG tablet Take 1 tablet (70 mg total) by mouth once a week. Take with a full glass of water  on an empty stomach.   amLODipine  (NORVASC ) 5 MG tablet Take 1 tablet (5 mg total) by mouth daily.   gabapentin  (NEURONTIN ) 300 MG capsule Take 1 capsule (300 mg total) by mouth 3 (three) times daily.   ipratropium-albuterol  (DUONEB) 0.5-2.5 (3) MG/3ML SOLN Take 3 mLs by nebulization every 6 (six) hours as needed.   levocetirizine (XYZAL ) 5 MG tablet Take 1 tablet (5 mg total) by mouth every evening.   Multiple Vitamins-Minerals (MULTIVITAMIN ADULT PO) Take by mouth daily.   mupirocin  ointment (BACTROBAN ) 2 % Apply 1 Application topically 2 (two) times daily.   tiZANidine  (ZANAFLEX ) 4 MG tablet Take 1 tablet (4 mg total) by mouth 3 (three) times daily.   [DISCONTINUED] ALPRAZolam  (XANAX ) 0.5 MG tablet Take 1 tablet (0.5 mg total) by mouth 2 (two) times daily as needed for anxiety.   [DISCONTINUED] citalopram  (CELEXA )  40 MG tablet Take 0.5 tablets (20 mg total) by mouth 2 (two) times daily.   [DISCONTINUED] famotidine  (PEPCID ) 20 MG tablet TAKE 1 TABLET BY MOUTH TWICE A DAY   ALPRAZolam  (XANAX ) 0.5 MG tablet Take 1 tablet (0.5 mg total) by mouth 2 (two) times daily as needed for anxiety.   citalopram  (CELEXA ) 40 MG tablet Take 0.5 tablets (20 mg total) by mouth 2 (two) times daily.   famotidine  (PEPCID ) 20 MG tablet Take 1 tablet (20 mg total) by mouth 2 (two) times daily.   [DISCONTINUED] albuterol  (VENTOLIN  HFA) 108 (90 Base) MCG/ACT inhaler TAKE 2 PUFFS BY MOUTH EVERY 6 HOURS AS NEEDED FOR WHEEZE OR SHORTNESS OF BREATH   [DISCONTINUED] ALPRAZolam  (XANAX ) 0.5 MG tablet Take 1 tablet (0.5 mg total) by mouth 2 (two) times daily as needed for anxiety.   [DISCONTINUED] amLODipine  (NORVASC ) 5 MG tablet Take 1 tablet (5 mg total) by mouth daily.   [DISCONTINUED] citalopram  (CELEXA ) 40 MG tablet Take 0.5 tablets (20 mg total) by mouth 2 (two) times daily.   [DISCONTINUED] cyclobenzaprine  (FLEXERIL ) 10 MG tablet Take 1 tablet (10 mg total) by mouth at bedtime as needed for muscle spasms.   [DISCONTINUED] methocarbamol  (ROBAXIN ) 500 MG tablet Take 1-2 tablets (500-1,000 mg total) by mouth every 6 (six) hours as needed for muscle spasms.   No facility-administered encounter medications on file as of 10/16/2024.    Surgical History: Past Surgical History:  Procedure Laterality Date   COLONOSCOPY WITH PROPOFOL  N/A 03/04/2024  Procedure: COLONOSCOPY WITH PROPOFOL ;  Surgeon: Therisa Bi, MD;  Location: [facility] ENDOSCOPY;  Service: Gastroenterology;  Laterality: N/A;   POLYPECTOMY  03/04/2024   Procedure: POLYPECTOMY, INTESTINE;  Surgeon: Therisa Bi, MD;  Location: [facility] ENDOSCOPY;  Service: Gastroenterology;;   TUBAL LIGATION      Medical History: Past Medical History:  Diagnosis Date   Anxiety    Depression    GERD (gastroesophageal reflux disease)    Osteoporosis     Family History: Family History  Problem  Relation Age of Onset   Heart disease Mother    Hyperlipidemia Mother    Macular degeneration Mother    Glaucoma Mother    Hyperlipidemia Father    Aneurysm Father     Social History   Socioeconomic History   Marital status: Married    Spouse name: Not on file   Number of children: Not on file   Years of education: Not on file   Highest education level: Not on file  Occupational History   Not on file  Tobacco Use   Smoking status: Every Day    Current packs/day: 1.00    Average packs/day: 1 pack/day for 43.0 years (43.0 ttl pk-yrs)    Types: Cigarettes   Smokeless tobacco: Never   Tobacco comments:    pt smoking 10 a day 02/25/21  Vaping Use   Vaping status: Never Used  Substance and Sexual Activity   Alcohol use: Not Currently   Drug use: Not Currently   Sexual activity: Not on file  Other Topics Concern   Not on file  Social History Narrative   Not on file   Social Drivers of Health   Financial Resource Strain: Not on file  Food Insecurity: Not on file  Transportation Needs: Not on file  Physical Activity: Not on file  Stress: Not on file  Social Connections: Not on file  Intimate Partner Violence: Not on file      Review of Systems  Constitutional:  Negative for activity change, appetite change, chills, fatigue, fever and unexpected weight change.  HENT: Negative.  Negative for congestion, ear pain, rhinorrhea, sore throat and trouble swallowing.   Eyes: Negative.   Respiratory: Negative.  Negative for cough, chest tightness, shortness of breath and wheezing.   Cardiovascular: Negative.  Negative for chest pain.  Gastrointestinal: Negative.  Negative for abdominal pain, blood in stool, constipation, diarrhea, nausea and vomiting.  Endocrine: Negative.   Genitourinary: Negative.  Negative for difficulty urinating, dysuria, frequency, hematuria and urgency.  Musculoskeletal: Negative.  Negative for arthralgias, back pain, joint swelling, myalgias and neck  pain.  Skin: Negative.  Negative for rash and wound.  Allergic/Immunologic: Negative.  Negative for immunocompromised state.  Neurological: Negative.  Negative for dizziness, seizures, numbness and headaches.  Hematological: Negative.   Psychiatric/Behavioral:  Positive for behavioral problems and sleep disturbance. Negative for self-injury and suicidal ideas. The patient is nervous/anxious.     Vital Signs: BP 138/80   Pulse 71   Temp (!) 96.3 F (35.7 C)   Resp 16   Ht 5' 2 (1.575 m)   Wt 147 lb 6.4 oz (66.9 kg)   SpO2 94%   BMI 26.96 kg/m    Physical Exam Vitals reviewed.  Constitutional:      General: She is awake. She is not in acute distress.    Appearance: Normal appearance. She is well-developed, well-groomed and normal weight. She is not ill-appearing or diaphoretic.  HENT:     Head: Normocephalic and atraumatic.  Right Ear: Tympanic membrane, ear canal and external ear normal.     Left Ear: Tympanic membrane, ear canal and external ear normal.     Nose: Nose normal. No congestion or rhinorrhea.     Mouth/Throat:     Lips: Pink.     Mouth: Mucous membranes are moist.     Pharynx: Oropharynx is clear. Uvula midline. No oropharyngeal exudate or posterior oropharyngeal erythema.  Eyes:     General: Lids are normal. Vision grossly intact. Gaze aligned appropriately. No scleral icterus.       Right eye: No discharge.        Left eye: No discharge.     Extraocular Movements: Extraocular movements intact.     Conjunctiva/sclera: Conjunctivae normal.     Pupils: Pupils are equal, round, and reactive to light.     Funduscopic exam:    Right eye: Red reflex present.        Left eye: Red reflex present. Neck:     Thyroid: No thyromegaly.     Vascular: No JVD.     Trachea: Trachea and phonation normal. No tracheal deviation.  Cardiovascular:     Rate and Rhythm: Normal rate and regular rhythm.     Heart sounds: Normal heart sounds, S1 normal and S2 normal. No  murmur heard.    No friction rub. No gallop.  Pulmonary:     Effort: Pulmonary effort is normal. No accessory muscle usage or respiratory distress.     Breath sounds: Normal breath sounds and air entry. No stridor. No wheezing or rales.  Chest:     Chest wall: No tenderness.     Comments: Declined clinical breast exam, gets annual mammograms.  Abdominal:     General: Bowel sounds are normal. There is no distension.     Palpations: Abdomen is soft. There is no shifting dullness, fluid wave, mass or pulsatile mass.     Tenderness: There is no abdominal tenderness. There is no guarding or rebound.  Musculoskeletal:        General: No tenderness or deformity. Normal range of motion.     Cervical back: Normal range of motion and neck supple.  Lymphadenopathy:     Cervical: No cervical adenopathy.  Skin:    General: Skin is warm and dry.     Capillary Refill: Capillary refill takes less than 2 seconds.     Coloration: Skin is not pale.     Findings: No erythema or rash.  Neurological:     Mental Status: She is alert and oriented to person, place, and time.     Cranial Nerves: No cranial nerve deficit.     Motor: No abnormal muscle tone.     Coordination: Coordination normal.     Gait: Gait normal.     Deep Tendon Reflexes: Reflexes are normal and symmetric.  Psychiatric:        Mood and Affect: Mood is anxious and depressed. Affect is tearful.        Speech: Speech normal.        Behavior: Behavior normal. Behavior is cooperative.        Thought Content: Thought content normal.        Judgment: Judgment normal.        Assessment/Plan: 1. Encounter for routine adult health examination with abnormal findings (Primary) Age-appropriate preventive screenings and vaccinations discussed, annual physical exam completed. Routine labs for health maintenance, see below. PHM updated.   - CBC with Differential/Platelet - CMP14+EGFR -  Lipid Profile - Vitamin D  (25 hydroxy) - B12 and  Folate Panel  2. Impetigo Continue mupirocin  3 times daily for 7 days. If no improvement, call the clinic for further instructions.   3. Gastroesophageal reflux disease without esophagitis Continue famotidine  as prescribed  - famotidine  (PEPCID ) 20 MG tablet; Take 1 tablet (20 mg total) by mouth 2 (two) times daily.  Dispense: 180 tablet; Refill: 2  4. Mixed hyperlipidemia Routine labs ordered  - CBC with Differential/Platelet - CMP14+EGFR - Lipid Profile - Vitamin D  (25 hydroxy) - B12 and Folate Panel  5. B12 deficiency Routine labs ordered  - CBC with Differential/Platelet - CMP14+EGFR - Lipid Profile - Vitamin D  (25 hydroxy) - B12 and Folate Panel  6. Vitamin D  deficiency Routine labs ordered  - CBC with Differential/Platelet - CMP14+EGFR - Lipid Profile - Vitamin D  (25 hydroxy) - B12 and Folate Panel  7. Dysuria Urine sent to lab - UA/M w/rflx Culture, Routine - Microscopic Examination  8. Generalized anxiety disorder Continue citalopram  as prescribed and continue prn alprazolam  as prescribed. Follow up in 3 months for additional refills and UDS due at next visit.  - ALPRAZolam  (XANAX ) 0.5 MG tablet; Take 1 tablet (0.5 mg total) by mouth 2 (two) times daily as needed for anxiety.  Dispense: 60 tablet; Refill: 2 - citalopram  (CELEXA ) 40 MG tablet; Take 0.5 tablets (20 mg total) by mouth 2 (two) times daily.  Dispense: 30 tablet; Refill: 2    General Counseling: Michala verbalizes understanding of the findings of todays visit and agrees with plan of treatment. I have discussed any further diagnostic evaluation that may be needed or ordered today. We also reviewed her medications today. she has been encouraged to call the office with any questions or concerns that should arise related to todays visit.    Orders Placed This Encounter  Procedures   UA/M w/rflx Culture, Routine   CBC with Differential/Platelet   CMP14+EGFR   Lipid Profile   Vitamin D  (25 hydroxy)    B12 and Folate Panel    Meds ordered this encounter  Medications   ALPRAZolam  (XANAX ) 0.5 MG tablet    Sig: Take 1 tablet (0.5 mg total) by mouth 2 (two) times daily as needed for anxiety.    Dispense:  60 tablet    Refill:  2    For future refills. It is medically necessary for the patient, do not let her run out.   citalopram  (CELEXA ) 40 MG tablet    Sig: Take 0.5 tablets (20 mg total) by mouth 2 (two) times daily.    Dispense:  30 tablet    Refill:  2   famotidine  (PEPCID ) 20 MG tablet    Sig: Take 1 tablet (20 mg total) by mouth 2 (two) times daily.    Dispense:  180 tablet    Refill:  2    Return in about 3 months (around 01/07/2025) for F/U, anxiety med refill, Labs, Selia Wareing PCP, have labs done before visit in february. .   Total time spent:30 Minutes Time spent includes review of chart, medications, test results, and follow up plan with the patient.   [place] Controlled Substance Database was reviewed by me.  This patient was seen by Mardy Maxin, FNP-C in collaboration with Dr. Sigrid Bathe as a part of collaborative care agreement.  Caryn Gienger R. Maxin, MSN, FNP-C Internal medicine

## 2024-10-17 LAB — MICROSCOPIC EXAMINATION
Bacteria, UA: NONE SEEN
Casts: NONE SEEN /LPF
Epithelial Cells (non renal): NONE SEEN /HPF (ref 0–10)
RBC, Urine: NONE SEEN /HPF (ref 0–2)
WBC, UA: NONE SEEN /HPF (ref 0–5)

## 2024-10-17 LAB — UA/M W/RFLX CULTURE, ROUTINE
Bilirubin, UA: NEGATIVE
Glucose, UA: NEGATIVE
Ketones, UA: NEGATIVE
Leukocytes,UA: NEGATIVE
Nitrite, UA: NEGATIVE
Protein,UA: NEGATIVE
RBC, UA: NEGATIVE
Specific Gravity, UA: <=1.005 — AB (ref 1.005–1.030)
Urobilinogen, Ur: 0.2 mg/dL (ref 0.2–1.0)
pH, UA: 5.5 (ref 5.0–7.5)

## 2024-10-18 ENCOUNTER — Encounter: Payer: Self-pay | Admitting: Nurse Practitioner

## 2024-10-18 DIAGNOSIS — E782 Mixed hyperlipidemia: Secondary | ICD-10-CM | POA: Insufficient documentation

## 2024-10-18 DIAGNOSIS — E559 Vitamin D deficiency, unspecified: Secondary | ICD-10-CM | POA: Insufficient documentation

## 2024-10-23 ENCOUNTER — Ambulatory Visit: Admitting: Nurse Practitioner

## 2024-10-23 DIAGNOSIS — H0288A Meibomian gland dysfunction right eye, upper and lower eyelids: Secondary | ICD-10-CM | POA: Diagnosis not present

## 2024-10-23 DIAGNOSIS — H0288B Meibomian gland dysfunction left eye, upper and lower eyelids: Secondary | ICD-10-CM | POA: Diagnosis not present

## 2024-10-30 ENCOUNTER — Inpatient Hospital Stay: Admission: RE | Admit: 2024-10-30 | Discharge: 2024-10-30 | Attending: Nurse Practitioner

## 2024-10-30 DIAGNOSIS — Z1231 Encounter for screening mammogram for malignant neoplasm of breast: Secondary | ICD-10-CM | POA: Diagnosis present

## 2024-11-19 ENCOUNTER — Telehealth: Payer: Self-pay

## 2024-11-19 NOTE — Telephone Encounter (Signed)
 Spoke to patient in regards to her 8 week follow up appointment; if she has been to physical therapy. She stated she has not been to physical therapy, and needs to cancel her appointment. Her insurance is terminating tomorrow. She does have an appointment with insurance next week to get other plan. She will call us  back to reschedule her appointment and have a new referral sent for physical therapy.

## 2024-11-23 ENCOUNTER — Other Ambulatory Visit: Payer: Self-pay | Admitting: Nurse Practitioner

## 2024-11-23 DIAGNOSIS — F411 Generalized anxiety disorder: Secondary | ICD-10-CM

## 2024-11-23 DIAGNOSIS — M25512 Pain in left shoulder: Secondary | ICD-10-CM

## 2024-11-25 ENCOUNTER — Ambulatory Visit: Admitting: Physician Assistant

## 2024-12-07 ENCOUNTER — Other Ambulatory Visit: Payer: Self-pay | Admitting: Physician Assistant

## 2025-01-07 ENCOUNTER — Ambulatory Visit: Admitting: Nurse Practitioner

## 2025-01-27 ENCOUNTER — Other Ambulatory Visit: Payer: Self-pay
